# Patient Record
Sex: Female | Born: 1955 | Race: White | Hispanic: No | Marital: Married | Smoking: Former smoker
Health system: Southern US, Community
[De-identification: ages and names within clinical notes are randomized; demographics above are authoritative.]

## PROBLEM LIST (undated history)

## (undated) DIAGNOSIS — Z87442 Personal history of urinary calculi: Secondary | ICD-10-CM

## (undated) DIAGNOSIS — Z87440 Personal history of urinary (tract) infections: Secondary | ICD-10-CM

## (undated) HISTORY — DX: Personal history of urinary calculi: Z87.442

## (undated) HISTORY — DX: Personal history of urinary (tract) infections: Z87.440

---

## 2012-06-14 HISTORY — PX: FOOT SURGERY: SHX648

## 2013-09-30 ENCOUNTER — Emergency Department: Payer: Self-pay | Admitting: Emergency Medicine

## 2014-06-14 HISTORY — PX: BREAST BIOPSY: SHX20

## 2014-11-17 DIAGNOSIS — R928 Other abnormal and inconclusive findings on diagnostic imaging of breast: Secondary | ICD-10-CM | POA: Insufficient documentation
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
## 2016-07-15 DIAGNOSIS — Z23 Encounter for immunization: Secondary | ICD-10-CM | POA: Diagnosis not present

## 2016-12-20 ENCOUNTER — Other Ambulatory Visit (HOSPITAL_COMMUNITY)
Admission: RE | Admit: 2016-12-20 | Discharge: 2016-12-20 | Disposition: A | Discharge status: Home / Self Care | Payer: BLUE CROSS/BLUE SHIELD | Source: Ambulatory Visit | Attending: Primary Care | Admitting: Primary Care

## 2016-12-20 ENCOUNTER — Ambulatory Visit (INDEPENDENT_AMBULATORY_CARE_PROVIDER_SITE_OTHER): Payer: BLUE CROSS/BLUE SHIELD | Admitting: Primary Care

## 2016-12-20 ENCOUNTER — Encounter: Payer: Self-pay | Admitting: Primary Care

## 2016-12-20 VITALS — BP 140/80 | HR 87 | Temp 98.7°F | Ht 65.75 in | Wt 151.1 lb

## 2016-12-20 DIAGNOSIS — Z1231 Encounter for screening mammogram for malignant neoplasm of breast: Secondary | ICD-10-CM

## 2016-12-20 DIAGNOSIS — Z0000 Encounter for general adult medical examination without abnormal findings: Secondary | ICD-10-CM

## 2016-12-20 DIAGNOSIS — Z23 Encounter for immunization: Secondary | ICD-10-CM | POA: Diagnosis not present

## 2016-12-20 DIAGNOSIS — Z1239 Encounter for other screening for malignant neoplasm of breast: Secondary | ICD-10-CM

## 2016-12-20 MED ORDER — ZOSTER VAC RECOMB ADJUVANTED 50 MCG/0.5ML IM SUSR: 0.5000 mL | Freq: Once | INTRAMUSCULAR | 1 refills | Status: AC

## 2016-12-20 NOTE — Assessment & Plan Note (Signed)
Shingles vaccination due, Rx printed today. All other immunizations UTD. Mammogram due, ordered and pending. Colonoscopy UTD. Pap due, completed and pending. Exam unremarkable. Labs pending. Follow up in 1 year for annual exam.

## 2016-12-20 NOTE — Patient Instructions (Addendum)
You will be contacted regarding your mammogram.  Please let us know if you have not heard back within one week.   Take the shingles vaccination to your pharmacy for administration. This is a two part vaccination series.  Continue exercising. You should be getting 150 minutes of moderate intensity exercise weekly.  Ensure you are consuming 64 ounces of water daily.  We will notify you of your pap results once received.   Schedule a lab only appointment up front to return fasting for your labs.  Follow up in 1 year for annual exam or sooner if needed.  It was a pleasure to meet you today! Please don't hesitate to call me with any questions. Welcome to [facility]!

## 2016-12-20 NOTE — Progress Notes (Signed)
Subjective:    Patient ID: Kayla Wood, female    DOB: [birth year], 61 y.o.   MRN: [id number]  HPI  Ms. Kayla Wood is a 61 year old female who presents today to establish care and for complete physical. Will obtain old records.   Immunizations: -Tetanus: Completed in 2016 -Influenza: Did complete last season -Shingles: Never completed  Diet: Endorses a healthy diet Breakfast: Yogurt, granola, fruit Lunch: Salad with protein, [place] sandwich, beans Dinner: Meat, vegetable, salad Snacks: Popcorn, pretzels Desserts: Chocolate occasionally  Beverages: Water, coffee in the morning, diet soda, red wine  Exercise: She walks 4-5 days weekly, runs 2 days weekly. Works out at [employer] 2 days weekly. Eye exam: Completed in January 2018 Dental exam: Completes semi-annually Colonoscopy: Completed in 2012, thinks she's due now.  Pap Smear: Completed in 2013, due.  Mammogram: Completed in 2016, due.    Review of Systems  Constitutional: Negative for unexpected weight change.  HENT: Negative for rhinorrhea.   Respiratory: Negative for cough and shortness of breath.   Cardiovascular: Negative for chest pain.  Gastrointestinal: Negative for constipation and diarrhea.  Genitourinary: Negative for difficulty urinating and menstrual problem.  Musculoskeletal: Negative for arthralgias and myalgias.  Skin: Negative for rash.  Allergic/Immunologic: Negative for environmental allergies.  Neurological: Negative for dizziness, numbness and headaches.  Psychiatric/Behavioral:       No concerns for anxiety or depression       Past Medical History:  Diagnosis Date  . History of kidney stones   . History of UTI      Social History   Social History  . Marital status: Married    Spouse name: N/A  . Number of children: N/A  . Years of education: N/A   Occupational History  . Not on file.   Social History Main Topics  . Smoking status: Former Games developer  . Smokeless tobacco: Never Used  . Alcohol  use Yes  . Drug use: Unknown  . Sexual activity: Not on file   Other Topics Concern  . Not on file   Social History Narrative   Married.   3 children.   Works for [employer].   Enjoys music, exercising, reading.     Past Surgical History:  Procedure Laterality Date  . BREAST BIOPSY Left 2016  . CESAREAN SECTION  1988  . FOOT SURGERY Bilateral 2014    Family History  Problem Relation Age of Onset  . Heart disease Father   . Breast cancer Sister   . Alcohol abuse Maternal Uncle   . Alcohol abuse Maternal Uncle     No Known Allergies  No current outpatient prescriptions on file prior to visit.   No current facility-administered medications on file prior to visit.     BP 140/80   Pulse 87   Temp 98.7 F (37.1 C) (Oral)   Ht 5' 5.75" (1.67 m)   Wt 151 lb 1.9 oz (68.5 kg)   SpO2 98%   BMI 24.58 kg/m    Objective:   Physical Exam  Constitutional: She is oriented to person, place, and time. She appears well-nourished.  HENT:  Right Ear: Tympanic membrane and ear canal normal.  Left Ear: Tympanic membrane and ear canal normal.  Nose: Nose normal.  Mouth/Throat: Oropharynx is clear and moist.  Eyes: Conjunctivae and EOM are normal. Pupils are equal, round, and reactive to light.  Neck: Neck supple. No thyromegaly present.  Cardiovascular: Normal rate and regular rhythm.   No murmur heard. Pulmonary/Chest: Effort  normal and breath sounds normal. She has no rales.  Abdominal: Soft. Bowel sounds are normal. There is no tenderness.  Genitourinary: There is no tenderness or lesion on the right labia. There is no tenderness or lesion on the left labia. Cervix exhibits no motion tenderness and no discharge. Right adnexum displays no tenderness. Left adnexum displays no tenderness. No erythema in the vagina. No vaginal discharge found.  Musculoskeletal: Normal range of motion.  Lymphadenopathy:    She has no cervical adenopathy.  Neurological: She is alert and oriented to  person, place, and time. She has normal reflexes. No cranial nerve deficit.  Skin: Skin is warm and dry. No rash noted.  Psychiatric: She has a normal mood and affect.          Assessment & Plan:

## 2016-12-21 ENCOUNTER — Telehealth: Payer: Self-pay | Admitting: Gastroenterology

## 2016-12-21 ENCOUNTER — Encounter: Payer: Self-pay | Admitting: Primary Care

## 2016-12-21 DIAGNOSIS — Z1211 Encounter for screening for malignant neoplasm of colon: Secondary | ICD-10-CM

## 2016-12-22 LAB — CYTOLOGY - PAP
Diagnosis: NEGATIVE
HPV: NOT DETECTED

## 2016-12-24 ENCOUNTER — Encounter: Payer: Self-pay | Admitting: Gastroenterology

## 2016-12-24 ENCOUNTER — Other Ambulatory Visit (INDEPENDENT_AMBULATORY_CARE_PROVIDER_SITE_OTHER): Payer: BLUE CROSS/BLUE SHIELD

## 2016-12-24 DIAGNOSIS — Z0000 Encounter for general adult medical examination without abnormal findings: Secondary | ICD-10-CM | POA: Diagnosis not present

## 2016-12-24 LAB — LIPID PANEL
Cholesterol: 194 mg/dL (ref 0–200)
HDL: 62.3 mg/dL (ref >39.00)
LDL CALC: 121 mg/dL — AB (ref 0–99)
NONHDL: 131.83
Total CHOL/HDL Ratio: 3
Triglycerides: 52 mg/dL (ref 0.0–149.0)
VLDL: 10.4 mg/dL (ref 0.0–40.0)

## 2016-12-24 LAB — COMPREHENSIVE METABOLIC PANEL
ALK PHOS: 69 U/L (ref 39–117)
ALT: 15 U/L (ref 0–35)
AST: 21 U/L (ref 0–37)
Albumin: 4.6 g/dL (ref 3.5–5.2)
BUN: 21 mg/dL (ref 6–23)
CHLORIDE: 103 meq/L (ref 96–112)
CO2: 28 mEq/L (ref 19–32)
Calcium: 9.9 mg/dL (ref 8.4–10.5)
Creatinine, Ser: 0.74 mg/dL (ref 0.40–1.20)
GFR: 84.74 mL/min (ref >60.00)
GLUCOSE: 102 mg/dL — AB (ref 70–99)
Potassium: 4.8 mEq/L (ref 3.5–5.1)
SODIUM: 139 meq/L (ref 135–145)
Total Bilirubin: 1 mg/dL (ref 0.2–1.2)
Total Protein: 7.4 g/dL (ref 6.0–8.3)

## 2016-12-24 NOTE — Telephone Encounter (Signed)
Dr.Armbruster reviewed records and accepted to see patient for an office visit to discuss direct colonoscopy. Notified patient of this and have scheduled for next available office visit.

## 2016-12-28 ENCOUNTER — Encounter: Payer: Self-pay | Admitting: Primary Care

## 2016-12-31 DIAGNOSIS — Z1231 Encounter for screening mammogram for malignant neoplasm of breast: Secondary | ICD-10-CM | POA: Diagnosis not present

## 2017-01-03 ENCOUNTER — Encounter: Payer: Self-pay | Admitting: Primary Care

## 2017-02-15 ENCOUNTER — Encounter: Payer: Self-pay | Admitting: Gastroenterology

## 2017-02-15 ENCOUNTER — Ambulatory Visit (INDEPENDENT_AMBULATORY_CARE_PROVIDER_SITE_OTHER): Payer: BLUE CROSS/BLUE SHIELD | Admitting: Gastroenterology

## 2017-02-15 VITALS — BP 130/80 | HR 80 | Ht 65.75 in | Wt 154.8 lb

## 2017-02-15 DIAGNOSIS — Z1211 Encounter for screening for malignant neoplasm of colon: Secondary | ICD-10-CM | POA: Diagnosis not present

## 2017-02-15 NOTE — Patient Instructions (Signed)
Follow up as needed.  If you are age 61 or older, your body mass index should be between 23-30. Your Body mass index is 25.18 kg/m. If this is out of the aforementioned range listed, please consider follow up with your Primary Care Provider.  If you are age 11064 or younger, your body mass index should be between 19-25. Your Body mass index is 25.18 kg/m. If this is out of the aformentioned range listed, please consider follow up with your Primary Care Provider.

## 2017-02-15 NOTE — Progress Notes (Signed)
   HPI :  61 y/o female with a history of renal stones, otherwise healthy referred here by Vernona RiegerKatherine Clark NP to discuss timing of next screening colonoscopy.   No blood in the stools. NO FH of colon cancer or advanced colon polyps / adenomas. No constipation or diarrhea. No abdominal pains. No weight loss. She feels well in general without complaints, no significant comorbidities  Colonoscopy 02/17/2011 - done by Dr. Jovita GammaMasud Hashmi at [facility] in [place]. Good prep. Normal colon without polyps, but recommendation in the report to repeat the colonoscopy in 5 years. The patient is confused by this recommendation.  Past Medical History:  Diagnosis Date  . History of kidney stones   . History of UTI      Past Surgical History:  Procedure Laterality Date  . BREAST BIOPSY Left 2016  . CESAREAN SECTION  1988  . FOOT SURGERY Bilateral 2014   Family History  Problem Relation Age of Onset  . Heart disease Father   . Breast cancer Sister   . Irritable bowel syndrome Sister   . Alcohol abuse Maternal Uncle   . Alcohol abuse Maternal Uncle    Social History  Substance Use Topics  . Smoking status: Former Games developermoker  . Smokeless tobacco: Never Used  . Alcohol use Yes   Current Outpatient Prescriptions  Medication Sig Dispense Refill  . ibuprofen (ADVIL,MOTRIN) 200 MG tablet Take 200 mg by mouth every 8 (eight) hours as needed.     No current facility-administered medications for this visit.    No Known Allergies   Review of Systems: All systems reviewed and negative except where noted in HPI.   No results found for: WBC, HGB, HCT, MCV, PLT  .  Physical Exam: BP 130/80   Pulse 80   Ht 5' 5.75" (1.67 m)   Wt 154 lb 12.8 oz (70.2 kg)   BMI 25.18 kg/m  Constitutional: Pleasant,well-developed, female in no acute distress. HEENT: Normocephalic and atraumatic. Conjunctivae are normal. No scleral icterus. Neck supple.  Cardiovascular: Normal rate, regular rhythm.    Pulmonary/chest: Effort normal and breath sounds normal. No wheezing, rales or rhonchi. Abdominal: Soft, nondistended, nontender. . There are no masses palpable. No hepatomegaly. Extremities: no edema Lymphadenopathy: No cervical adenopathy noted. Neurological: Alert and oriented to person place and time. Skin: Skin is warm and dry. No rashes noted. Psychiatric: Normal mood and affect. Behavior is normal.   ASSESSMENT AND PLAN: 61 year old healthy female here to discuss colon cancer screening in the timing of her next colonoscopy. I reviewed her last colonoscopy - the preparation was reported as good, she had no polyps, visualization was good. She has no family history of colon cancer or advanced polyps. She has no anemia. I don't see any reason why she was told she another colonoscopy in 5 years from her last exam. Based on the results of her last exam she should not be due for screening until September 2022. We discussed this and she agreed, she did not want a colonoscopy unless she had a strong indication for it. She has symptoms in the interim she can contact me for reassessment.  Ileene PatrickSteven Najai Waszak, MD [facility] Pager [phone]  CC: Doreene Nestlark, Katherine K, NP

## 2017-05-10 DIAGNOSIS — Z23 Encounter for immunization: Secondary | ICD-10-CM | POA: Diagnosis not present

## 2017-08-10 DIAGNOSIS — L814 Other melanin hyperpigmentation: Secondary | ICD-10-CM | POA: Diagnosis not present

## 2017-08-10 DIAGNOSIS — L718 Other rosacea: Secondary | ICD-10-CM | POA: Diagnosis not present

## 2017-09-02 DIAGNOSIS — Z23 Encounter for immunization: Secondary | ICD-10-CM | POA: Diagnosis not present

## 2017-11-21 DIAGNOSIS — L718 Other rosacea: Secondary | ICD-10-CM | POA: Diagnosis not present

## 2017-11-21 DIAGNOSIS — L821 Other seborrheic keratosis: Secondary | ICD-10-CM | POA: Diagnosis not present

## 2017-11-21 DIAGNOSIS — L814 Other melanin hyperpigmentation: Secondary | ICD-10-CM | POA: Diagnosis not present

## 2018-03-14 DIAGNOSIS — Z1231 Encounter for screening mammogram for malignant neoplasm of breast: Secondary | ICD-10-CM | POA: Diagnosis not present

## 2018-04-21 DIAGNOSIS — Z23 Encounter for immunization: Secondary | ICD-10-CM | POA: Diagnosis not present

## 2018-06-19 DIAGNOSIS — L718 Other rosacea: Secondary | ICD-10-CM | POA: Diagnosis not present

## 2018-12-18 DIAGNOSIS — L718 Other rosacea: Secondary | ICD-10-CM | POA: Diagnosis not present

## 2019-04-03 DIAGNOSIS — Z1231 Encounter for screening mammogram for malignant neoplasm of breast: Secondary | ICD-10-CM | POA: Diagnosis not present

## 2019-04-05 ENCOUNTER — Encounter: Payer: Self-pay | Admitting: Primary Care

## 2019-05-03 DIAGNOSIS — L718 Other rosacea: Secondary | ICD-10-CM | POA: Diagnosis not present

## 2019-05-16 DIAGNOSIS — R928 Other abnormal and inconclusive findings on diagnostic imaging of breast: Secondary | ICD-10-CM | POA: Diagnosis not present

## 2019-05-16 LAB — HM MAMMOGRAPHY

## 2019-05-17 ENCOUNTER — Encounter: Payer: Self-pay | Admitting: Primary Care

## 2019-08-20 ENCOUNTER — Ambulatory Visit (INDEPENDENT_AMBULATORY_CARE_PROVIDER_SITE_OTHER)
Admission: RE | Admit: 2019-08-20 | Discharge: 2019-08-20 | Disposition: A | Discharge status: Home / Self Care | Payer: BC Managed Care – PPO | Source: Ambulatory Visit

## 2019-08-20 DIAGNOSIS — N23 Unspecified renal colic: Secondary | ICD-10-CM | POA: Diagnosis not present

## 2019-08-20 MED ORDER — KETOROLAC TROMETHAMINE 10 MG PO TABS: 10.0000 mg | ORAL_TABLET | Freq: Three times a day (TID) | ORAL | 0 refills | Status: DC | PRN

## 2019-08-20 NOTE — ED Provider Notes (Signed)
Virtual Visit via Video Note:  Kayla Wood  initiated request for Telemedicine visit with [facility] team. I connected with Kayla Wood  on 08/20/2019 at 4:25 PM  for a synchronized telemedicine visit using a video enabled HIPPA compliant telemedicine application. I verified that I am speaking with Kayla Wood  using two identifiers. Kayla Hall-Potvin, PA-C  was physically located in a [facility] site and Kayla Wood was located at a different location.   The limitations of evaluation and management by telemedicine as well as the availability of in-person appointments were discussed. Patient was informed that she  may incur a bill ( including co-pay) for this virtual visit encounter. Kayla Wood  expressed understanding and gave verbal consent to proceed with virtual visit.     History of Present Illness:Kayla Wood  is a 64 y.o. female presents with left flank pain since Friday.  States pain is worse at night when laying down, though tolerable during the day.  States is a dull, aching, constant during the day, sharp at night.  Patient does endorse history of kidney stone: 1 episode of nonobstructing stone 8 to 10 years ago.  States this feels similar.  Patient denies overlying rash.  Did have single episode of emesis on Saturday without blood or bile.  No nausea, vomiting, abdominal pain since.  Patient denies change in bowel movement or urinary habits: No blood, melena in bowel, blood in urine.  States she does have a history of chickenpox: Had Shingrix vaccine 2 years ago.  Denies recent change in medications or activity level.  No trauma/injury to the area.  Patient denies fever, chills, arthralgias or myalgias, chest pain, difficulty breathing, cough.  Has taken Tylenol, ibuprofen without significant relief in pain.     ROI as per HPI  Past Medical History:  Diagnosis Date  . History of kidney stones   . History of UTI     No Known Allergies       Observations/Objective: 64 year old female sitting in no acute distress, appears to be nontoxic.  Patient is able to speak in full sentences without coughing, sneezing, wheezing.  No rash visualized over left flank.  Assessment and Plan: We will treat for possible renal lithiasis given renal colic and patient's history.  Provided anticipatory guidance regarding shingles.  Patient follow-up with PCP for repeat evaluation if needed.  Discussed utility of urine dipstick should there be no improvement with Toradol.  Return precautions discussed, patient verbalized understanding and is agreeable to plan.  Follow Up Instructions: Patient to seek in-person evaluation for persistent or worsening symptoms as outlined in patient instructions.   I discussed the assessment and treatment plan with the patient. The patient was provided an opportunity to ask questions and all were answered. The patient agreed with the plan and demonstrated an understanding of the instructions.   The patient was advised to call back or seek an in-person evaluation if the symptoms worsen or if the condition fails to improve as anticipated.  I provided 20 minutes of non-face-to-face time during this encounter.    Kayla Hall-Potvin, PA-C  08/20/2019 4:25 PM        Wood, Kayla, [place] 08/20/19 1648

## 2019-08-20 NOTE — Discharge Instructions (Signed)
Take Toradol as directed with food. Use hot compresses, Tylenol for additional pain relief. Important follow-up with primary care to inform them of today's visit and treatment. We will need to be seen in person for persistent or worsening symptoms such as fever, nausea/vomiting, abdominal pain, decreased urine output, cola colored urine, blood in urine, inability to urinate, black or blood in stools.

## 2019-08-22 ENCOUNTER — Other Ambulatory Visit: Payer: Self-pay | Admitting: Primary Care

## 2019-08-22 ENCOUNTER — Ambulatory Visit: Payer: BC Managed Care – PPO | Admitting: Primary Care

## 2019-08-22 ENCOUNTER — Ambulatory Visit (INDEPENDENT_AMBULATORY_CARE_PROVIDER_SITE_OTHER)
Admission: RE | Admit: 2019-08-22 | Discharge: 2019-08-22 | Disposition: A | Discharge status: Home / Self Care | Payer: BC Managed Care – PPO | Source: Ambulatory Visit | Attending: Primary Care | Admitting: Primary Care

## 2019-08-22 ENCOUNTER — Other Ambulatory Visit: Payer: Self-pay

## 2019-08-22 VITALS — BP 136/86 | HR 84 | Temp 96.7°F | Ht 65.75 in | Wt 178.8 lb

## 2019-08-22 DIAGNOSIS — R109 Unspecified abdominal pain: Secondary | ICD-10-CM | POA: Diagnosis not present

## 2019-08-22 LAB — POC URINALSYSI DIPSTICK (AUTOMATED)
Bilirubin, UA: NEGATIVE
Blood, UA: NEGATIVE
Glucose, UA: NEGATIVE
Ketones, UA: NEGATIVE
Leukocytes, UA: NEGATIVE
Nitrite, UA: NEGATIVE
Protein, UA: NEGATIVE
Spec Grav, UA: <=1.005 — AB (ref 1.010–1.025)
Urobilinogen, UA: 0.2 E.U./dL
pH, UA: 6 (ref 5.0–8.0)

## 2019-08-22 MED ORDER — CYCLOBENZAPRINE HCL 5 MG PO TABS: 5.0000 mg | ORAL_TABLET | Freq: Every evening | ORAL | 0 refills | Status: DC | PRN

## 2019-08-22 NOTE — Assessment & Plan Note (Addendum)
Acute for the last five days, overall about the same. History of renal stones 8 years ago, this feels somewhat similar but more mild.  Exam today stable, she is in no distress, no CVA tenderness. She has normal ROM, doesn't seem to be MSK related especially since she can walk 3 miles without pain. No evidence for shingles.   UA today negative.  Check plain films in the office today, consider CT renal stone study if needed. Continue Ibuprofen PRN.

## 2019-08-22 NOTE — Progress Notes (Signed)
Subjective:    Patient ID: Kayla Wood, female    DOB: [birth year], 64 y.o.   MRN: [id number]  HPI  This visit occurred during the SARS-CoV-2 public health emergency.  Safety protocols were in place, including screening questions prior to the visit, additional usage of staff PPE, and extensive cleaning of exam room while observing appropriate contact time as indicated for disinfecting solutions.   Kayla Wood is a 64 year old female with a history of renal stones, UTI who presents today with a chief complaint of flank pain.   Her pain is located to the left lower flank for which she noticed about five days ago with a "twinge" sensation. Her pain is noticeable during the day, but not bothersome. Only bothersome when laying down at night. She describes her pain as a "stabbing" sensation when it does bother her. She has noticed some nausea, vomited five days ago. This morning she walked 3 miles and had no problem.   She denies hematuria, urinary frequency, dysuria, radiation of pain, fevers, abdominal pain, rash, recent strenuous activity, heavy lifting. She endorses drinking a lot of water.   She completed a virtual visit on 08/20/19, treated for "possible renal lithiasis" with Toradol. She's not noticed improvement.   BP Readings from Last 3 Encounters:  08/22/19 136/86  02/15/17 130/80  12/20/16 140/80     Review of Systems  Constitutional: Negative for fever.  Gastrointestinal: Positive for nausea and vomiting. Negative for abdominal pain.  Genitourinary: Positive for flank pain. Negative for dysuria, frequency and hematuria.       Past Medical History:  Diagnosis Date  . History of kidney stones   . History of UTI      Social History   Socioeconomic History  . Marital status: Married    Spouse name: Not on file  . Number of children: 3  . Years of education: Not on file  . Highest education level: Not on file  Occupational History  . Not on file  Tobacco Use  . Smoking  status: Former Games developer  . Smokeless tobacco: Never Used  Substance and Sexual Activity  . Alcohol use: Yes  . Drug use: No  . Sexual activity: Not on file  Other Topics Concern  . Not on file  Social History Narrative   Married.   3 children.   Works for [employer].   Enjoys music, exercising, reading.    Social Determinants of Health   Financial Resource Strain:   . Difficulty of Paying Living Expenses: Not on file  Food Insecurity:   . Worried About Programme researcher, broadcasting/film/video in the Last Year: Not on file  . Ran Out of Food in the Last Year: Not on file  Transportation Needs:   . Lack of Transportation (Medical): Not on file  . Lack of Transportation (Non-Medical): Not on file  Physical Activity:   . Days of Exercise per Week: Not on file  . Minutes of Exercise per Session: Not on file  Stress:   . Feeling of Stress : Not on file  Social Connections:   . Frequency of Communication with Friends and Family: Not on file  . Frequency of Social Gatherings with Friends and Family: Not on file  . Attends Religious Services: Not on file  . Active Member of Clubs or Organizations: Not on file  . Attends Banker Meetings: Not on file  . Marital Status: Not on file  Intimate Partner Violence:   . Fear of Current  or Ex-Partner: Not on file  . Emotionally Abused: Not on file  . Physically Abused: Not on file  . Sexually Abused: Not on file    Past Surgical History:  Procedure Laterality Date  . BREAST BIOPSY Left 2016  . CESAREAN SECTION  1988  . FOOT SURGERY Bilateral 2014    Family History  Problem Relation Age of Onset  . Heart disease Father   . Breast cancer Sister   . Irritable bowel syndrome Sister   . Alcohol abuse Maternal Uncle   . Alcohol abuse Maternal Uncle     No Known Allergies  Current Outpatient Medications on File Prior to Visit  Medication Sig Dispense Refill  . ibuprofen (ADVIL,MOTRIN) 200 MG tablet Take 200 mg by mouth every 8 (eight) hours as  needed.     No current facility-administered medications on file prior to visit.    BP 136/86   Pulse 84   Temp (!) 96.7 F (35.9 C) (Temporal)   Ht 5' 5.75" (1.67 m)   Wt 178 lb 12 oz (81.1 kg)   SpO2 97%   BMI 29.07 kg/m    Objective:   Physical Exam  Constitutional: She appears well-nourished.  Cardiovascular: Normal rate and regular rhythm.  GI: There is no CVA tenderness.  Musculoskeletal:        General: Normal range of motion.     Cervical back: Neck supple.  Skin: Skin is warm and dry.           Assessment & Plan:

## 2019-08-22 NOTE — Patient Instructions (Signed)
Complete xray(s) prior to leaving today. I will notify you of your results once received.  It was a pleasure to see you today!  

## 2019-08-24 DIAGNOSIS — R109 Unspecified abdominal pain: Secondary | ICD-10-CM

## 2019-08-31 ENCOUNTER — Ambulatory Visit: Payer: BC Managed Care – PPO | Attending: Internal Medicine

## 2019-08-31 DIAGNOSIS — Z23 Encounter for immunization: Secondary | ICD-10-CM

## 2019-08-31 NOTE — Progress Notes (Signed)
   Covid-19 Vaccination Clinic  Name:  Kayla Wood    MRN: [id number] DOB: [birth year]  08/31/2019  [name] was observed post Covid-19 immunization for 15 minutes without incident. She was provided with Vaccine Information Sheet and instruction to access the V-Safe system.   [name] was instructed to call 911 with any severe reactions post vaccine: Marland Kitchen Difficulty breathing  . Swelling of face and throat  . A fast heartbeat  . A bad rash all over body  . Dizziness and weakness   Immunizations Administered    Name Date Dose VIS Date Route   Pfizer COVID-19 Vaccine 08/31/2019  1:30 PM 0.3 mL 05/25/2019 Intramuscular   Manufacturer: [employer], [employer]   Lot: [id number]   NDC: [address]-0

## 2019-09-03 ENCOUNTER — Ambulatory Visit
Admission: RE | Admit: 2019-09-03 | Discharge: 2019-09-03 | Disposition: A | Discharge status: Home / Self Care | Payer: BC Managed Care – PPO | Source: Ambulatory Visit | Attending: Primary Care | Admitting: Primary Care

## 2019-09-03 ENCOUNTER — Other Ambulatory Visit: Payer: Self-pay

## 2019-09-03 DIAGNOSIS — R109 Unspecified abdominal pain: Secondary | ICD-10-CM | POA: Insufficient documentation

## 2019-09-03 DIAGNOSIS — N200 Calculus of kidney: Secondary | ICD-10-CM | POA: Diagnosis not present

## 2019-09-03 DIAGNOSIS — K449 Diaphragmatic hernia without obstruction or gangrene: Secondary | ICD-10-CM | POA: Diagnosis not present

## 2019-09-03 DIAGNOSIS — R161 Splenomegaly, not elsewhere classified: Secondary | ICD-10-CM | POA: Diagnosis not present

## 2019-09-03 DIAGNOSIS — K5730 Diverticulosis of large intestine without perforation or abscess without bleeding: Secondary | ICD-10-CM | POA: Diagnosis not present

## 2019-09-25 ENCOUNTER — Ambulatory Visit: Payer: BC Managed Care – PPO | Attending: Internal Medicine

## 2019-09-25 DIAGNOSIS — Z23 Encounter for immunization: Secondary | ICD-10-CM

## 2019-09-25 NOTE — Progress Notes (Signed)
   Covid-19 Vaccination Clinic  Name:  Kayla Wood    MRN: [id number] DOB: 1956/01/08  09/25/2019  [name] was observed post Covid-19 immunization for 15 minutes without incident. She was provided with Vaccine Information Sheet and instruction to access the V-Safe system.   [name] was instructed to call 911 with any severe reactions post vaccine: Marland Kitchen Difficulty breathing  . Swelling of face and throat  . A fast heartbeat  . A bad rash all over body  . Dizziness and weakness   Immunizations Administered    Name Date Dose VIS Date Route   Pfizer COVID-19 Vaccine 09/25/2019  2:26 PM 0.3 mL 05/25/2019 Intramuscular   Manufacturer: [employer], [employer]   Lot: [id number]   NDC: [address]-4

## 2019-12-03 DIAGNOSIS — S63659D Sprain of metacarpophalangeal joint of unspecified finger, subsequent encounter: Secondary | ICD-10-CM | POA: Diagnosis not present

## 2019-12-03 DIAGNOSIS — M25641 Stiffness of right hand, not elsewhere classified: Secondary | ICD-10-CM | POA: Diagnosis not present

## 2019-12-03 DIAGNOSIS — S63659A Sprain of metacarpophalangeal joint of unspecified finger, initial encounter: Secondary | ICD-10-CM | POA: Diagnosis not present

## 2019-12-03 DIAGNOSIS — M65341 Trigger finger, right ring finger: Secondary | ICD-10-CM | POA: Diagnosis not present

## 2019-12-27 DIAGNOSIS — M20091 Other deformity of right finger(s): Secondary | ICD-10-CM | POA: Diagnosis not present

## 2019-12-27 DIAGNOSIS — M79672 Pain in left foot: Secondary | ICD-10-CM | POA: Diagnosis not present

## 2019-12-27 DIAGNOSIS — M79671 Pain in right foot: Secondary | ICD-10-CM | POA: Diagnosis not present

## 2019-12-27 DIAGNOSIS — M2041 Other hammer toe(s) (acquired), right foot: Secondary | ICD-10-CM | POA: Diagnosis not present

## 2020-01-01 DIAGNOSIS — M65341 Trigger finger, right ring finger: Secondary | ICD-10-CM | POA: Diagnosis not present

## 2020-01-01 DIAGNOSIS — S63659A Sprain of metacarpophalangeal joint of unspecified finger, initial encounter: Secondary | ICD-10-CM | POA: Diagnosis not present

## 2020-01-14 DIAGNOSIS — M2041 Other hammer toe(s) (acquired), right foot: Secondary | ICD-10-CM | POA: Diagnosis not present

## 2020-01-14 DIAGNOSIS — M20091 Other deformity of right finger(s): Secondary | ICD-10-CM | POA: Diagnosis not present

## 2020-01-14 DIAGNOSIS — M79671 Pain in right foot: Secondary | ICD-10-CM | POA: Diagnosis not present

## 2020-01-14 DIAGNOSIS — M79672 Pain in left foot: Secondary | ICD-10-CM | POA: Diagnosis not present

## 2020-03-07 DIAGNOSIS — S66316A Strain of extensor muscle, fascia and tendon of right little finger at wrist and hand level, initial encounter: Secondary | ICD-10-CM | POA: Diagnosis not present

## 2020-03-07 DIAGNOSIS — G8918 Other acute postprocedural pain: Secondary | ICD-10-CM | POA: Diagnosis not present

## 2020-03-07 DIAGNOSIS — S66312A Strain of extensor muscle, fascia and tendon of right middle finger at wrist and hand level, initial encounter: Secondary | ICD-10-CM | POA: Diagnosis not present

## 2020-03-07 DIAGNOSIS — M66231 Spontaneous rupture of extensor tendons, right forearm: Secondary | ICD-10-CM | POA: Diagnosis not present

## 2020-03-07 DIAGNOSIS — S66314A Strain of extensor muscle, fascia and tendon of right ring finger at wrist and hand level, initial encounter: Secondary | ICD-10-CM | POA: Diagnosis not present

## 2020-03-07 HISTORY — PX: HAND SURGERY: SHX662

## 2020-03-11 DIAGNOSIS — R600 Localized edema: Secondary | ICD-10-CM | POA: Diagnosis not present

## 2020-03-11 DIAGNOSIS — M25641 Stiffness of right hand, not elsewhere classified: Secondary | ICD-10-CM | POA: Diagnosis not present

## 2020-03-11 DIAGNOSIS — S63659A Sprain of metacarpophalangeal joint of unspecified finger, initial encounter: Secondary | ICD-10-CM | POA: Diagnosis not present

## 2020-03-11 DIAGNOSIS — M79644 Pain in right finger(s): Secondary | ICD-10-CM | POA: Diagnosis not present

## 2020-05-20 ENCOUNTER — Encounter: Payer: Self-pay | Admitting: Primary Care

## 2020-05-20 ENCOUNTER — Other Ambulatory Visit (HOSPITAL_COMMUNITY)
Admission: RE | Admit: 2020-05-20 | Discharge: 2020-05-20 | Disposition: A | Discharge status: Home / Self Care | Payer: BC Managed Care – PPO | Source: Ambulatory Visit | Attending: Primary Care | Admitting: Primary Care

## 2020-05-20 ENCOUNTER — Ambulatory Visit (INDEPENDENT_AMBULATORY_CARE_PROVIDER_SITE_OTHER): Payer: BC Managed Care – PPO | Admitting: Primary Care

## 2020-05-20 ENCOUNTER — Other Ambulatory Visit: Payer: Self-pay

## 2020-05-20 VITALS — BP 126/82 | HR 68 | Temp 97.2°F | Ht 67.5 in | Wt 169.0 lb

## 2020-05-20 DIAGNOSIS — Z0000 Encounter for general adult medical examination without abnormal findings: Secondary | ICD-10-CM | POA: Diagnosis not present

## 2020-05-20 DIAGNOSIS — Z1211 Encounter for screening for malignant neoplasm of colon: Secondary | ICD-10-CM

## 2020-05-20 DIAGNOSIS — Z124 Encounter for screening for malignant neoplasm of cervix: Secondary | ICD-10-CM | POA: Diagnosis not present

## 2020-05-20 NOTE — Patient Instructions (Signed)
Please scan your labs onto My Chart once complete.  You will be contacted regarding your referral to GI for the colonoscopy.  Please let us know if you have not been contacted within two weeks.   Continue exercising. You should be getting 150 minutes of moderate intensity exercise weekly.  Continue to work on a healthy diet. Ensure you are consuming 64 ounces of water daily.  Complete your mammogram as scheduled.  Make sure to take at least 1200 mg of calcium and 800 IU of vitamin D daily for bone health.  It was a pleasure to see you today!   Preventive Care 69-64 Years Old, Female Preventive care refers to visits with your health care provider and lifestyle choices that can promote health and wellness. This includes:  A yearly physical exam. This may also be called an annual well check.  Regular dental visits and eye exams.  Immunizations.  Screening for certain conditions.  Healthy lifestyle choices, such as eating a healthy diet, getting regular exercise, not using drugs or products that contain nicotine and tobacco, and limiting alcohol use. What can I expect for my preventive care visit? Physical exam Your health care provider will check your:  Height and weight. This may be used to calculate body mass index (BMI), which tells if you are at a healthy weight.  Heart rate and blood pressure.  Skin for abnormal spots. Counseling Your health care provider may ask you questions about your:  Alcohol, tobacco, and drug use.  Emotional well-being.  Home and relationship well-being.  Sexual activity.  Eating habits.  Work and work Statistician.  Method of birth control.  Menstrual cycle.  Pregnancy history. What immunizations do I need?  Influenza (flu) vaccine  This is recommended every year. Tetanus, diphtheria, and pertussis (Tdap) vaccine  You may need a Td booster every 10 years. Varicella (chickenpox) vaccine  You may need this if you have not been  vaccinated. Zoster (shingles) vaccine  You may need this after age 2. Measles, mumps, and rubella (MMR) vaccine  You may need at least one dose of MMR if you were born in 1957 or later. You may also need a second dose. Pneumococcal conjugate (PCV13) vaccine  You may need this if you have certain conditions and were not previously vaccinated. Pneumococcal polysaccharide (PPSV23) vaccine  You may need one or two doses if you smoke cigarettes or if you have certain conditions. Meningococcal conjugate (MenACWY) vaccine  You may need this if you have certain conditions. Hepatitis A vaccine  You may need this if you have certain conditions or if you travel or work in places where you may be exposed to hepatitis A. Hepatitis B vaccine  You may need this if you have certain conditions or if you travel or work in places where you may be exposed to hepatitis B. Haemophilus influenzae type b (Hib) vaccine  You may need this if you have certain conditions. Human papillomavirus (HPV) vaccine  If recommended by your health care provider, you may need three doses over 6 months. You may receive vaccines as individual doses or as more than one vaccine together in one shot (combination vaccines). Talk with your health care provider about the risks and benefits of combination vaccines. What tests do I need? Blood tests  Lipid and cholesterol levels. These may be checked every 5 years, or more frequently if you are over 1 years old.  Hepatitis C test.  Hepatitis B test. Screening  Lung cancer screening. You  may have this screening every year starting at age 29 if you have a 30-pack-year history of smoking and currently smoke or have quit within the past 15 years.  Colorectal cancer screening. All adults should have this screening starting at age 84 and continuing until age 76. Your health care provider may recommend screening at age 58 if you are at increased risk. You will have tests every  1-10 years, depending on your results and the type of screening test.  Diabetes screening. This is done by checking your blood sugar (glucose) after you have not eaten for a while (fasting). You may have this done every 1-3 years.  Mammogram. This may be done every 1-2 years. Talk with your health care provider about when you should start having regular mammograms. This may depend on whether you have a family history of breast cancer.  BRCA-related cancer screening. This may be done if you have a family history of breast, ovarian, tubal, or peritoneal cancers.  Pelvic exam and Pap test. This may be done every 3 years starting at age 48. Starting at age 88, this may be done every 5 years if you have a Pap test in combination with an HPV test. Other tests  Sexually transmitted disease (STD) testing.  Bone density scan. This is done to screen for osteoporosis. You may have this scan if you are at high risk for osteoporosis. Follow these instructions at home: Eating and drinking  Eat a diet that includes fresh fruits and vegetables, whole grains, lean protein, and low-fat dairy.  Take vitamin and mineral supplements as recommended by your health care provider.  Do not drink alcohol if: ? Your health care provider tells you not to drink. ? You are pregnant, may be pregnant, or are planning to become pregnant.  If you drink alcohol: ? Limit how much you have to 0-1 drink a day. ? Be aware of how much alcohol is in your drink. In the [place] one drink equals one 12 oz bottle of beer (355 mL), one 5 oz glass of wine (148 mL), or one 1 oz glass of hard liquor (44 mL). Lifestyle  Take daily care of your teeth and gums.  Stay active. Exercise for at least 30 minutes on 5 or more days each week.  Do not use any products that contain nicotine or tobacco, such as cigarettes, e-cigarettes, and chewing tobacco. If you need help quitting, ask your health care provider.  If you are sexually active,  practice safe sex. Use a condom or other form of birth control (contraception) in order to prevent pregnancy and STIs (sexually transmitted infections).  If told by your health care provider, take low-dose aspirin daily starting at age 37. What's next?  Visit your health care provider once a year for a well check visit.  Ask your health care provider how often you should have your eyes and teeth checked.  Stay up to date on all vaccines. This information is not intended to replace advice given to you by your health care provider. Make sure you discuss any questions you have with your health care provider. Document Revised: 02/09/2018 Document Reviewed: 02/09/2018 Elsevier Patient Education  2020 [employer].

## 2020-05-20 NOTE — Progress Notes (Signed)
Subjective:    Patient ID: Kayla Wood, female    DOB: Oct 03, 1955, 64 y.o.   MRN: [id number]  HPI  This visit occurred during the SARS-CoV-2 public health emergency.  Safety protocols were in place, including screening questions prior to the visit, additional usage of staff PPE, and extensive cleaning of exam room while observing appropriate contact time as indicated for disinfecting solutions.   Kayla Wood is a 64 year old female who presents today for complete physical.  Immunizations: -Tetanus: Completed in 2020 -Influenza: Completed this season  -Shingles: Completed Shingrix series -Covid-19: Completed series  Diet: She endorses a fair diet.  Exercise: She is walking five mornings weekly.   Eye exam: Completes annually  Dental exam: Completes semi-annually   Pap Smear: Completed in 2018, due.  Mammogram: Completed in December 2020 and scheduled.  Colonoscopy: Completed in 2012, due in 2022 Hep C Screen:  BP Readings from Last 3 Encounters:  05/20/20 126/82  08/22/19 136/86  02/15/17 130/80   Wt Readings from Last 3 Encounters:  05/20/20 169 lb (76.7 kg)  08/22/19 178 lb 12 oz (81.1 kg)  02/15/17 154 lb 12.8 oz (70.2 kg)      Review of Systems  Constitutional: Negative for unexpected weight change.  HENT: Negative for rhinorrhea.   Eyes: Negative for visual disturbance.  Respiratory: Negative for cough and shortness of breath.   Cardiovascular: Negative for chest pain.  Gastrointestinal: Negative for constipation and diarrhea.  Genitourinary: Negative for difficulty urinating.  Musculoskeletal: Negative for arthralgias and myalgias.  Skin: Negative for rash.  Allergic/Immunologic: Positive for environmental allergies.  Neurological: Negative for dizziness, numbness and headaches.  Psychiatric/Behavioral: The patient is not nervous/anxious.        Past Medical History:  Diagnosis Date  . History of kidney stones   . History of UTI      Social History    Socioeconomic History  . Marital status: Married    Spouse name: Not on file  . Number of children: 3  . Years of education: Not on file  . Highest education level: Not on file  Occupational History  . Not on file  Tobacco Use  . Smoking status: Former Games developer  . Smokeless tobacco: Never Used  Substance and Sexual Activity  . Alcohol use: Yes  . Drug use: No  . Sexual activity: Not on file  Other Topics Concern  . Not on file  Social History Narrative   Married.   3 children.   Works for [employer].   Enjoys music, exercising, reading.    Social Determinants of Health   Financial Resource Strain:   . Difficulty of Paying Living Expenses: Not on file  Food Insecurity:   . Worried About Programme researcher, broadcasting/film/video in the Last Year: Not on file  . Ran Out of Food in the Last Year: Not on file  Transportation Needs:   . Lack of Transportation (Medical): Not on file  . Lack of Transportation (Non-Medical): Not on file  Physical Activity:   . Days of Exercise per Week: Not on file  . Minutes of Exercise per Session: Not on file  Stress:   . Feeling of Stress : Not on file  Social Connections:   . Frequency of Communication with Friends and Family: Not on file  . Frequency of Social Gatherings with Friends and Family: Not on file  . Attends Religious Services: Not on file  . Active Member of Clubs or Organizations: Not on file  .  Attends Banker Meetings: Not on file  . Marital Status: Not on file  Intimate Partner Violence:   . Fear of Current or Ex-Partner: Not on file  . Emotionally Abused: Not on file  . Physically Abused: Not on file  . Sexually Abused: Not on file    Past Surgical History:  Procedure Laterality Date  . BREAST BIOPSY Left 2016  . CESAREAN SECTION  1988  . FOOT SURGERY Bilateral 2014  . HAND SURGERY Right 03/07/2020    Family History  Problem Relation Age of Onset  . Heart disease Father   . Breast cancer Sister   . Irritable bowel  syndrome Sister   . Alcohol abuse Maternal Uncle   . Alcohol abuse Maternal Uncle     No Known Allergies  Current Outpatient Medications on File Prior to Visit  Medication Sig Dispense Refill  . ibuprofen (ADVIL,MOTRIN) 200 MG tablet Take 200 mg by mouth every 8 (eight) hours as needed.    . Multiple Vitamin (MULTI-VITAMIN) tablet Take by mouth.     No current facility-administered medications on file prior to visit.    BP 126/82   Pulse 68   Temp (!) 97.2 F (36.2 C) (Temporal)   Ht 5' 7.5" (1.715 m)   Wt 169 lb (76.7 kg)   SpO2 97%   BMI 26.08 kg/m    Objective:   Physical Exam HENT:     Right Ear: Tympanic membrane and ear canal normal.     Left Ear: Tympanic membrane and ear canal normal.  Eyes:     Pupils: Pupils are equal, round, and reactive to light.  Cardiovascular:     Rate and Rhythm: Normal rate and regular rhythm.  Pulmonary:     Effort: Pulmonary effort is normal.     Breath sounds: Normal breath sounds.  Abdominal:     General: Bowel sounds are normal.     Palpations: Abdomen is soft.     Tenderness: There is no abdominal tenderness.  Genitourinary:    Labia:        Right: No tenderness or lesion.        Left: No tenderness or lesion.      Vagina: No vaginal discharge, erythema or tenderness.     Cervix: No cervical motion tenderness, discharge, lesion or cervical bleeding.     Uterus: Normal.      Adnexa: Right adnexa normal and left adnexa normal.  Musculoskeletal:        General: Normal range of motion.     Cervical back: Neck supple.  Skin:    General: Skin is warm and dry.  Neurological:     Mental Status: She is alert and oriented to person, place, and time.     Cranial Nerves: No cranial nerve deficit.     Deep Tendon Reflexes:     Reflex Scores:      Patellar reflexes are 2+ on the right side and 2+ on the left side. Psychiatric:        Mood and Affect: Mood normal.            Assessment & Plan:

## 2020-05-20 NOTE — Assessment & Plan Note (Signed)
Immunizations UTD, she will send [place] the dates of her Shingrix vaccines. Pap smear due, completed today. Mammogram scheduled for this month.  Discussed the importance of a healthy diet and regular exercise in order for weight loss, and to reduce the risk of any potential medical problems.  Exam today unremarkable. Labs completed recently with her employer, she will scan a copy onto her My Chart portal.

## 2020-05-21 LAB — CYTOLOGY - PAP
Comment: NEGATIVE
Diagnosis: NEGATIVE
High risk HPV: NEGATIVE

## 2020-06-12 DIAGNOSIS — Z1231 Encounter for screening mammogram for malignant neoplasm of breast: Secondary | ICD-10-CM | POA: Diagnosis not present

## 2020-06-24 ENCOUNTER — Encounter: Payer: Self-pay | Admitting: Gastroenterology

## 2020-07-24 DIAGNOSIS — R937 Abnormal findings on diagnostic imaging of other parts of musculoskeletal system: Secondary | ICD-10-CM | POA: Diagnosis not present

## 2020-07-24 DIAGNOSIS — M79671 Pain in right foot: Secondary | ICD-10-CM | POA: Diagnosis not present

## 2020-07-24 DIAGNOSIS — M20091 Other deformity of right finger(s): Secondary | ICD-10-CM | POA: Diagnosis not present

## 2020-07-24 DIAGNOSIS — M2041 Other hammer toe(s) (acquired), right foot: Secondary | ICD-10-CM | POA: Diagnosis not present

## 2020-07-30 ENCOUNTER — Other Ambulatory Visit: Payer: Self-pay | Admitting: Obstetrics & Gynecology

## 2020-07-30 DIAGNOSIS — R5381 Other malaise: Secondary | ICD-10-CM

## 2020-08-06 ENCOUNTER — Other Ambulatory Visit: Payer: Self-pay | Admitting: Obstetrics & Gynecology

## 2020-08-06 DIAGNOSIS — R937 Abnormal findings on diagnostic imaging of other parts of musculoskeletal system: Secondary | ICD-10-CM

## 2020-08-08 ENCOUNTER — Other Ambulatory Visit: Payer: Self-pay | Admitting: Obstetrics & Gynecology

## 2020-08-08 DIAGNOSIS — R5381 Other malaise: Secondary | ICD-10-CM

## 2020-08-11 ENCOUNTER — Other Ambulatory Visit: Payer: Self-pay | Admitting: Obstetrics & Gynecology

## 2020-08-11 DIAGNOSIS — Z13820 Encounter for screening for osteoporosis: Secondary | ICD-10-CM

## 2020-08-29 ENCOUNTER — Encounter: Payer: BC Managed Care – PPO | Admitting: Gastroenterology

## 2020-09-08 DIAGNOSIS — L814 Other melanin hyperpigmentation: Secondary | ICD-10-CM | POA: Diagnosis not present

## 2020-09-08 DIAGNOSIS — D1801 Hemangioma of skin and subcutaneous tissue: Secondary | ICD-10-CM | POA: Diagnosis not present

## 2020-09-08 DIAGNOSIS — L718 Other rosacea: Secondary | ICD-10-CM | POA: Diagnosis not present

## 2021-01-21 ENCOUNTER — Other Ambulatory Visit: Payer: BC Managed Care – PPO

## 2021-07-03 LAB — HM MAMMOGRAPHY

## 2021-07-07 ENCOUNTER — Encounter: Payer: Self-pay | Admitting: Primary Care

## 2021-07-09 ENCOUNTER — Other Ambulatory Visit: Payer: Self-pay

## 2021-12-02 IMAGING — CT CT RENAL STONE PROTOCOL
1 of 2 series · 15 of 32 positions shown, 19 images · non-contrast
Comparison: None.

CLINICAL DATA: Left-sided flank pain x2 weeks.

EXAM:
CT ABDOMEN AND PELVIS WITHOUT CONTRAST
TECHNIQUE: Multidetector CT imaging of the abdomen and pelvis was performed
following the standard protocol without IV contrast.

[Series 2: axial st · axial · 0.68mm/px · z∈[-979,-604]mm · 15 of 83 slices shown, 19 images]
[im 4/83  soft-tissue]
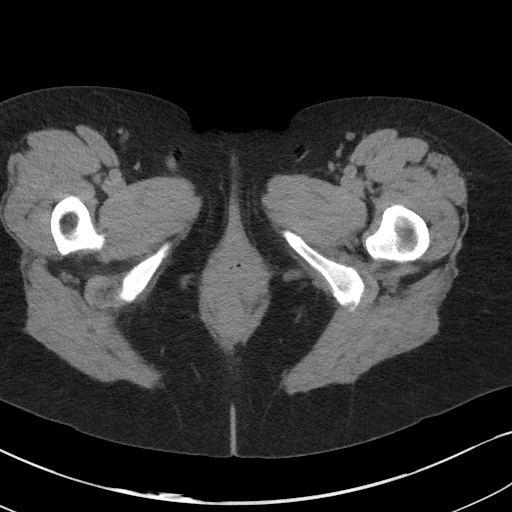
[im 4/83  bone]
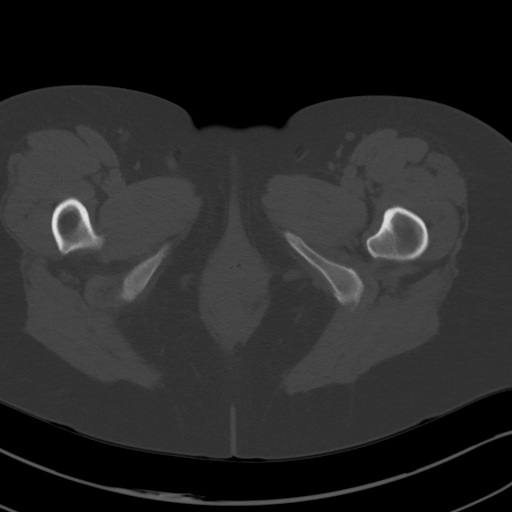
[im 10/83  soft-tissue]
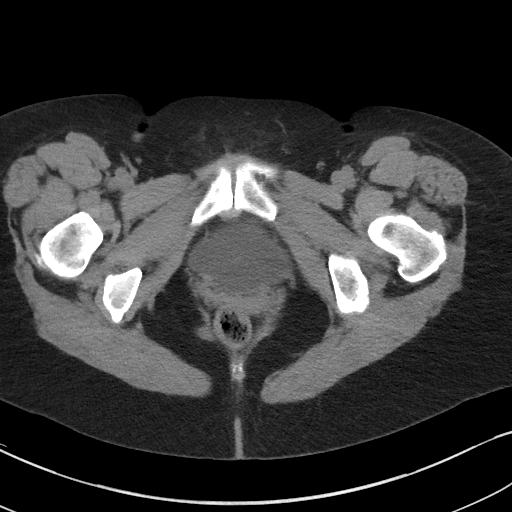
[im 17/83  soft-tissue]
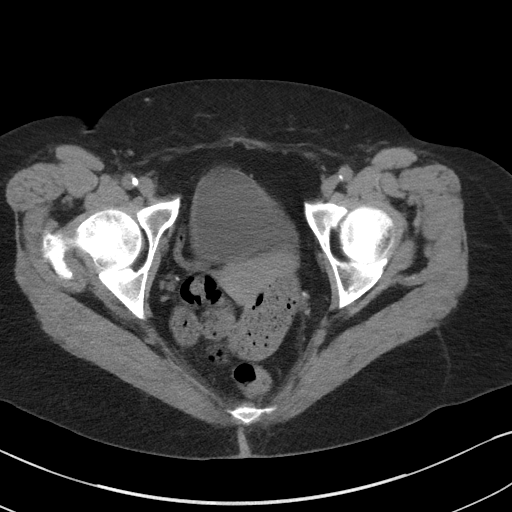
[im 23/83  soft-tissue]
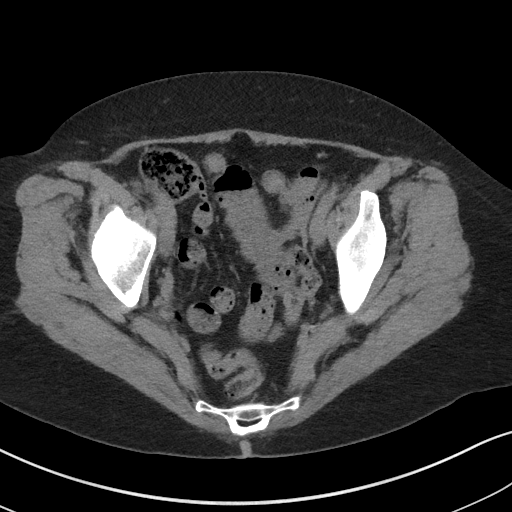
[im 30/83  soft-tissue]
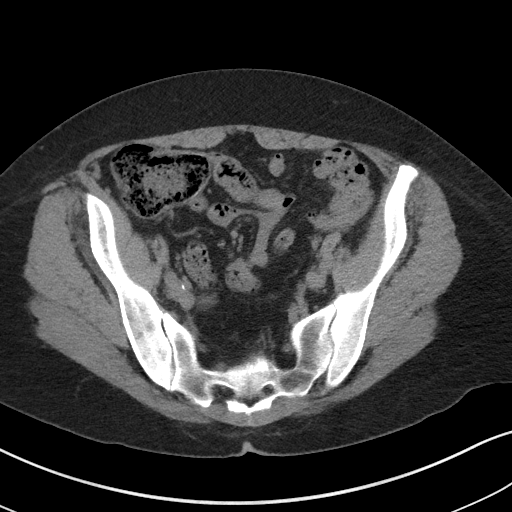
[im 37/83  soft-tissue]
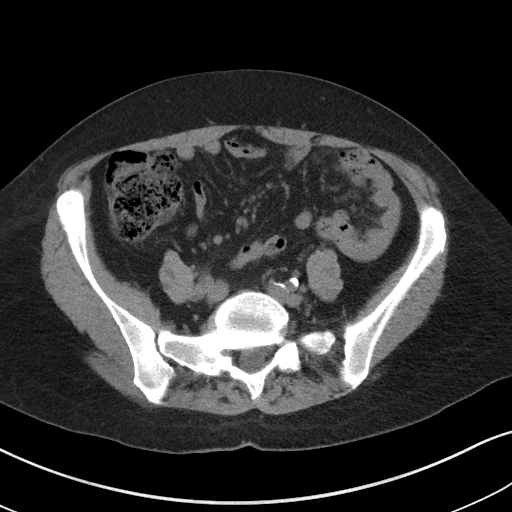
[im 43/83  soft-tissue]
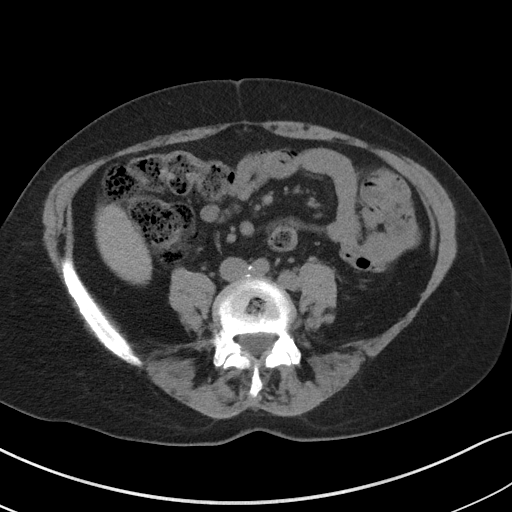
[im 46/83  soft-tissue]
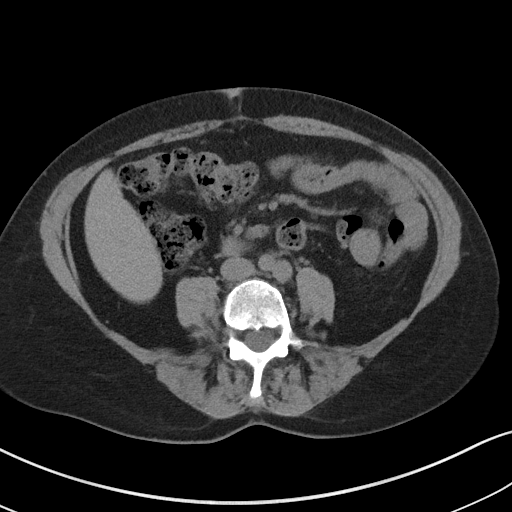
[im 53/83  soft-tissue]
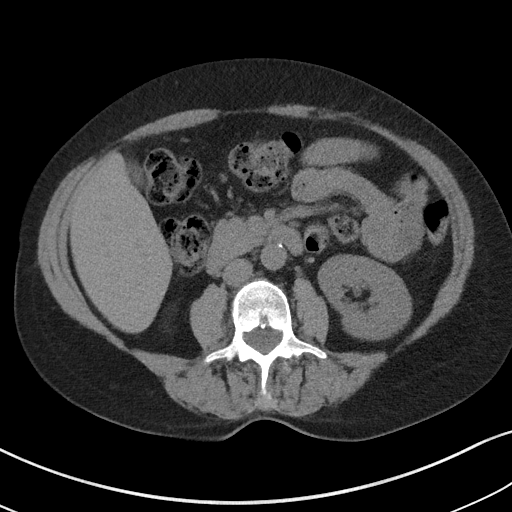
[im 53/83  bone]
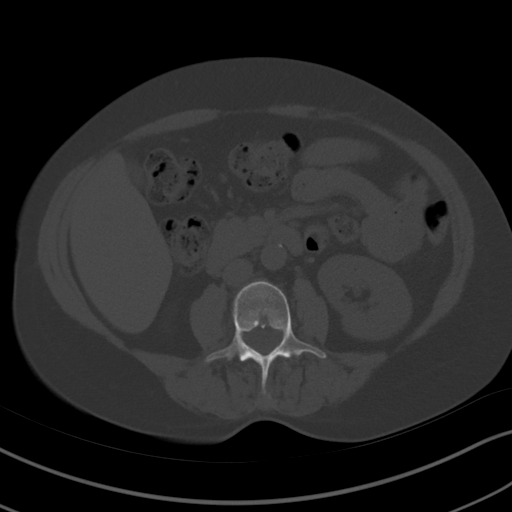
[im 60/83  soft-tissue]
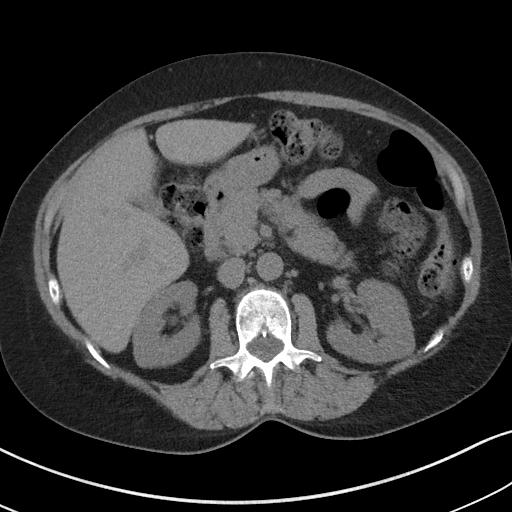
[im 66/83  soft-tissue]
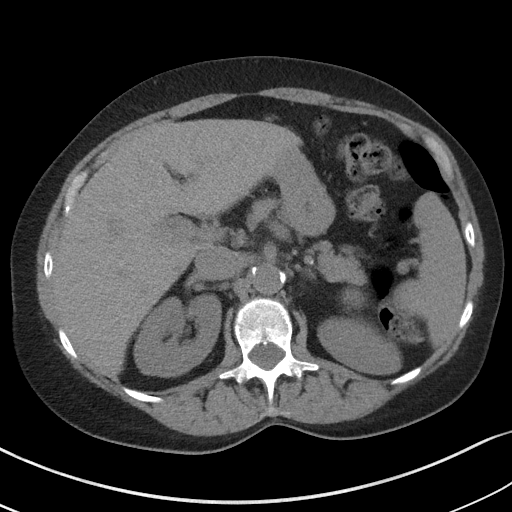
[im 69/83  lung]
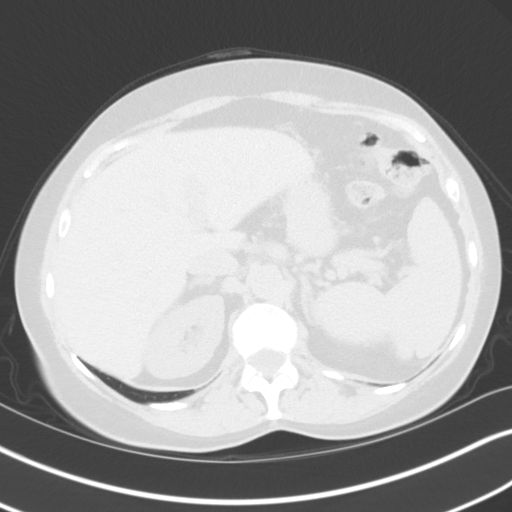
[im 73/83  soft-tissue]
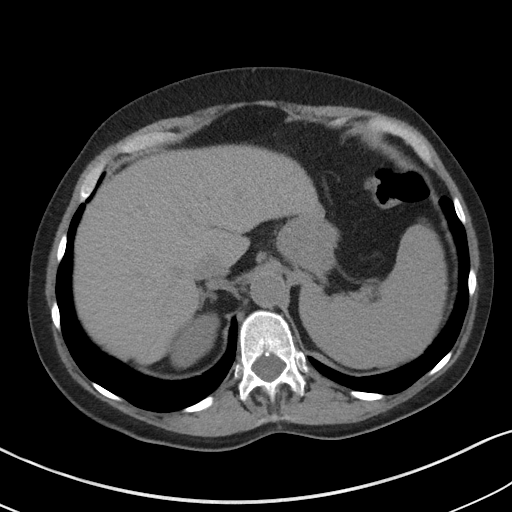
[im 73/83  lung]
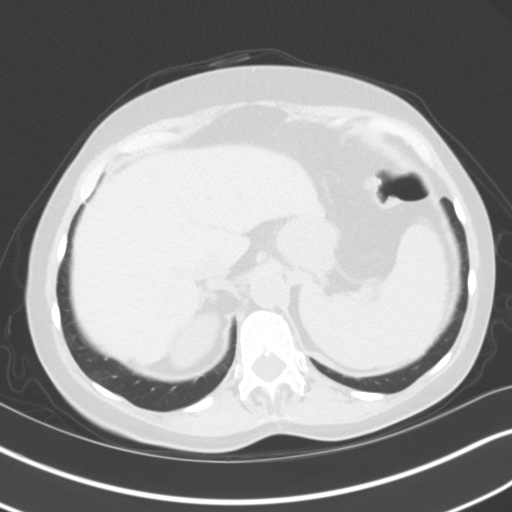
[im 76/83  lung]
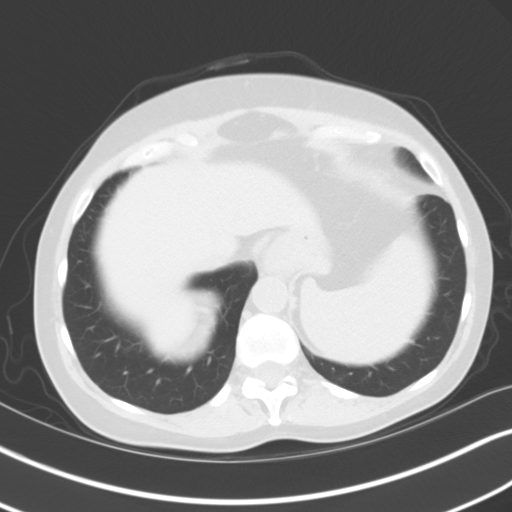
[im 79/83  soft-tissue]
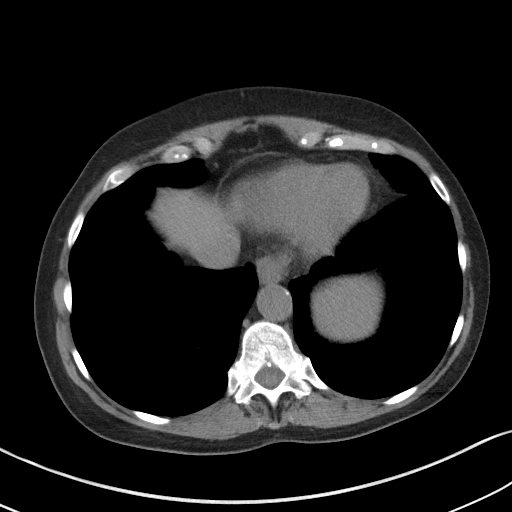
[im 79/83  lung]
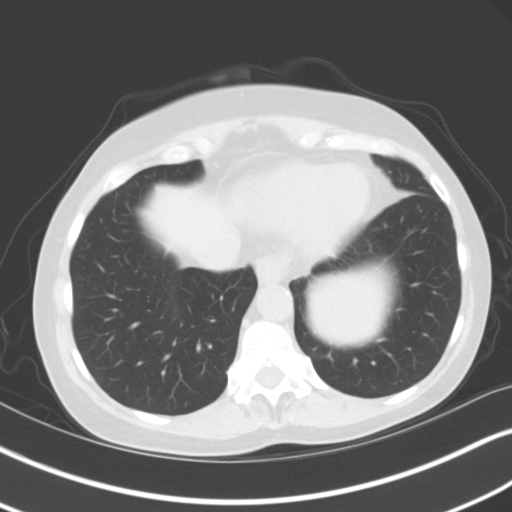

[15 of 32 positions shown; findings below may reference images not displayed]

FINDINGS: Lower chest: No acute abnormality.

Hepatobiliary: No focal liver abnormality is seen. No gallstones,
gallbladder wall thickening, or biliary dilatation.

Pancreas: Unremarkable. No pancreatic ductal dilatation or
surrounding inflammatory changes.

Spleen: The spleen is mildly enlarged.

Adrenals/Urinary Tract: Adrenal glands are unremarkable. Kidneys are
normal in size. A 6 mm focus of low attenuation is seen within the
anterior aspect of the mid right kidney. An extrarenal pelvis is
seen on the left. A 2 mm nonobstructing renal stone is seen within
the lower pole of the left kidney. There is no evidence of
hydronephrosis. Bladder is unremarkable.

Stomach/Bowel: There is a small hiatal hernia. Appendix appears
normal. No evidence of bowel wall thickening, distention, or
inflammatory changes. Noninflamed diverticula are seen throughout
the large bowel.

Vascular/Lymphatic: Mild aortic calcification. No enlarged abdominal
or pelvic lymph nodes.

Reproductive: Uterus and bilateral adnexa are unremarkable.

Other: No abdominal wall hernia or abnormality. No abdominopelvic
ascites.

Musculoskeletal: No acute or significant osseous findings.
IMPRESSION: 1. Mild splenomegaly.
2. 2 mm nonobstructing renal stone within the lower pole of the left
kidney.
3. Small hiatal hernia.
4. Colonic diverticulosis.

Aortic Atherosclerosis ([id number]).

## 2022-08-09 LAB — HM MAMMOGRAPHY

## 2022-09-09 ENCOUNTER — Encounter: Payer: Self-pay | Admitting: Primary Care

## 2022-09-24 ENCOUNTER — Encounter: Payer: Self-pay | Admitting: Primary Care

## 2022-09-24 ENCOUNTER — Ambulatory Visit (INDEPENDENT_AMBULATORY_CARE_PROVIDER_SITE_OTHER): Payer: Medicare Other | Admitting: Primary Care

## 2022-09-24 VITALS — BP 142/78 | HR 79 | Temp 97.7°F | Ht 67.0 in | Wt 179.0 lb

## 2022-09-24 DIAGNOSIS — E785 Hyperlipidemia, unspecified: Secondary | ICD-10-CM | POA: Insufficient documentation

## 2022-09-24 DIAGNOSIS — Z0000 Encounter for general adult medical examination without abnormal findings: Secondary | ICD-10-CM

## 2022-09-24 DIAGNOSIS — E2839 Other primary ovarian failure: Secondary | ICD-10-CM

## 2022-09-24 DIAGNOSIS — Z1211 Encounter for screening for malignant neoplasm of colon: Secondary | ICD-10-CM

## 2022-09-24 DIAGNOSIS — Z23 Encounter for immunization: Secondary | ICD-10-CM

## 2022-09-24 DIAGNOSIS — L719 Rosacea, unspecified: Secondary | ICD-10-CM

## 2022-09-24 LAB — COMPREHENSIVE METABOLIC PANEL
ALT: 21 U/L (ref 0–35)
AST: 23 U/L (ref 0–37)
Albumin: 4.7 g/dL (ref 3.5–5.2)
Alkaline Phosphatase: 75 U/L (ref 39–117)
BUN: 15 mg/dL (ref 6–23)
CO2: 25 mEq/L (ref 19–32)
Calcium: 9.7 mg/dL (ref 8.4–10.5)
Chloride: 103 mEq/L (ref 96–112)
Creatinine, Ser: 0.72 mg/dL (ref 0.40–1.20)
GFR: 86.8 mL/min (ref >60.00)
Glucose, Bld: 90 mg/dL (ref 70–99)
Potassium: 4.3 mEq/L (ref 3.5–5.1)
Sodium: 138 mEq/L (ref 135–145)
Total Bilirubin: 1.1 mg/dL (ref 0.2–1.2)
Total Protein: 7.8 g/dL (ref 6.0–8.3)

## 2022-09-24 LAB — CBC
HCT: 42.2 % (ref 36.0–46.0)
Hemoglobin: 14.5 g/dL (ref 12.0–15.0)
MCHC: 34.4 g/dL (ref 30.0–36.0)
MCV: 85.2 fl (ref 78.0–100.0)
Platelets: 199 10*3/uL (ref 150.0–400.0)
RBC: 4.95 Mil/uL (ref 3.87–5.11)
RDW: 13.1 % (ref 11.5–15.5)
WBC: 5.1 10*3/uL (ref 4.0–10.5)

## 2022-09-24 LAB — LIPID PANEL
Cholesterol: 194 mg/dL (ref 0–200)
HDL: 44.7 mg/dL (ref >39.00)
LDL Cholesterol: 129 mg/dL — ABNORMAL HIGH (ref 0–99)
NonHDL: 149.47
Total CHOL/HDL Ratio: 4
Triglycerides: 104 mg/dL (ref 0.0–149.0)
VLDL: 20.8 mg/dL (ref 0.0–40.0)

## 2022-09-24 NOTE — Assessment & Plan Note (Signed)
Prevnar 20 provided today. Mammogram UTD. Bone density scan due, orders placed Colonoscopy due, referral placed to GI  Discussed the importance of a healthy diet and regular exercise in order for weight loss, and to reduce the risk of further co-morbidity.  Exam stable. Labs pending.  Follow up in 1 year for repeat physical.

## 2022-09-24 NOTE — Assessment & Plan Note (Signed)
Following with dermatology. ° °Continue compounded cream.  °

## 2022-09-24 NOTE — Patient Instructions (Signed)
Stop by the lab prior to leaving today. I will notify you of your results once received.   [facility] density scan.   You will either be contacted via phone regarding your referral to GI for the colonoscopy, or you may receive a letter on your MyChart portal from our referral team with instructions for scheduling an appointment. Please let us know if you have not been contacted by anyone within two weeks.  It was a pleasure to see you today!

## 2022-09-24 NOTE — Assessment & Plan Note (Signed)
Repeat lipid panel pending. 

## 2022-09-24 NOTE — Progress Notes (Signed)
Subjective:    Patient ID: Kayla Wood, female    DOB: 02/26/1956, 67 y.o.   MRN: [id number]  HPI  Kayla Wood is a very pleasant 67 y.o. female who presents today for follow up of chronic conditions.  Immunizations: -Tetanus: Completed in 2020 -Shingles: Completed Shingrix series -Pneumonia: Never completed, due today.  Mammogram: February 2024 Bone Density Scan: Never completed   Colonoscopy: Completed in 2012, due 2022 and did not complete.   BP Readings from Last 3 Encounters:  09/24/22 (!) 142/80  05/20/20 126/82  08/22/19 136/86      Review of Systems  Constitutional:  Negative for unexpected weight change.  HENT:  Negative for rhinorrhea.   Respiratory:  Negative for cough and shortness of breath.   Cardiovascular:  Negative for chest pain.  Gastrointestinal:  Negative for constipation and diarrhea.  Genitourinary:  Negative for difficulty urinating.  Musculoskeletal:  Negative for arthralgias and myalgias.  Skin:  Negative for rash.  Allergic/Immunologic: Negative for environmental allergies.  Neurological:  Negative for dizziness and headaches.  Psychiatric/Behavioral:  The patient is not nervous/anxious.          Past Medical History:  Diagnosis Date   History of kidney stones    History of UTI     Social History   Socioeconomic History   Marital status: Married    Spouse name: Not on file   Number of children: 3   Years of education: Not on file   Highest education level: Not on file  Occupational History   Not on file  Tobacco Use   Smoking status: Former   Smokeless tobacco: Never  Substance and Sexual Activity   Alcohol use: Yes   Drug use: No   Sexual activity: Not on file  Other Topics Concern   Not on file  Social History Narrative   Married.   3 children.   Works for [employer].   Enjoys music, exercising, reading.    Social Determinants of Health   Financial Resource Strain: Not on file  Food Insecurity: Not on file   Transportation Needs: Not on file  Physical Activity: Not on file  Stress: Not on file  Social Connections: Not on file  Intimate Partner Violence: Not on file    Past Surgical History:  Procedure Laterality Date   BREAST BIOPSY Left 2016   CESAREAN SECTION  1988   FOOT SURGERY Bilateral 2014   HAND SURGERY Right 03/07/2020    Family History  Problem Relation Age of Onset   Heart disease Father    Breast cancer Sister    Irritable bowel syndrome Sister    Alcohol abuse Maternal Uncle    Alcohol abuse Maternal Uncle     No Known Allergies  Current Outpatient Medications on File Prior to Visit  Medication Sig Dispense Refill   calcium carbonate (SUPER CALCIUM) 1500 (600 Ca) MG TABS tablet 1 tablet with meals Orally Twice a day for 30 day(s)     Cholecalciferol 25 MCG (1000 UT) capsule Take by mouth.     ibuprofen (ADVIL,MOTRIN) 200 MG tablet Take 200 mg by mouth every 8 (eight) hours as needed.     Multiple Vitamin (MULTI-VITAMIN) tablet Take by mouth.     No current facility-administered medications on file prior to visit.    BP (!) 142/80   Pulse 79   Temp 97.7 F (36.5 C) (Temporal)   Ht  (1.702 m)   Wt 179 lb (81.2 kg)   SpO2 98%  BMI 28.04 kg/m  Objective:   Physical Exam HENT:     Right Ear: Tympanic membrane and ear canal normal.     Left Ear: Tympanic membrane and ear canal normal.     Nose: Nose normal.  Eyes:     Conjunctiva/sclera: Conjunctivae normal.     Pupils: Pupils are equal, round, and reactive to light.  Neck:     Thyroid: No thyromegaly.  Cardiovascular:     Rate and Rhythm: Normal rate and regular rhythm.     Heart sounds: No murmur heard. Pulmonary:     Effort: Pulmonary effort is normal.     Breath sounds: Normal breath sounds. No rales.  Abdominal:     General: Bowel sounds are normal.     Palpations: Abdomen is soft.     Tenderness: There is no abdominal tenderness.  Musculoskeletal:        General: Normal range of  motion.     Cervical back: Neck supple.  Lymphadenopathy:     Cervical: No cervical adenopathy.  Skin:    General: Skin is warm and dry.     Findings: No rash.  Neurological:     Mental Status: She is alert and oriented to person, place, and time.     Cranial Nerves: No cranial nerve deficit.     Deep Tendon Reflexes: Reflexes are normal and symmetric.  Psychiatric:        Mood and Affect: Mood normal.           Assessment & Plan:  Preventative health care Assessment & Plan: Prevnar 20 provided today. Mammogram UTD. Bone density scan due, orders placed Colonoscopy due, referral placed to GI  Discussed the importance of a healthy diet and regular exercise in order for weight loss, and to reduce the risk of further co-morbidity.  Exam stable. Labs pending.  Follow up in 1 year for repeat physical.    Rosacea Assessment & Plan: Following with dermatology. Continue compounded cream.    Hyperlipidemia, unspecified hyperlipidemia type Assessment & Plan: Repeat lipid panel pending.  Orders: -     Lipid panel -     Comprehensive metabolic panel -     CBC  Estrogen deficiency -     DG Bone Density; Future  Screening for colon cancer -     Ambulatory referral to Gastroenterology  Other orders -     Pneumococcal conjugate vaccine 20-valent        Doreene Nest, NP

## 2022-11-24 ENCOUNTER — Ambulatory Visit
Admission: RE | Admit: 2022-11-24 | Discharge: 2022-11-24 | Disposition: A | Discharge status: Home / Self Care | Payer: Medicare Other | Source: Ambulatory Visit | Attending: Primary Care | Admitting: Primary Care

## 2022-11-24 DIAGNOSIS — E2839 Other primary ovarian failure: Secondary | ICD-10-CM | POA: Diagnosis present

## 2022-11-30 ENCOUNTER — Ambulatory Visit (AMBULATORY_SURGERY_CENTER): Payer: Medicare Other

## 2022-11-30 VITALS — Ht 67.0 in | Wt 177.0 lb

## 2022-11-30 DIAGNOSIS — Z1211 Encounter for screening for malignant neoplasm of colon: Secondary | ICD-10-CM

## 2022-11-30 MED ORDER — PEG 3350-KCL-NA BICARB-NACL 420 G PO SOLR: 4000.0000 mL | Freq: Once | ORAL | 0 refills | Status: AC

## 2022-11-30 NOTE — Progress Notes (Signed)
No egg or soy allergy known to patient  No issues known to pt with past sedation with any surgeries or procedures Patient denies ever being told they had issues or difficulty with intubation  No FH of Malignant Hyperthermia Pt is not on diet pills Pt is not on  home 02  Pt is not on blood thinners  Pt denies issues with constipation  No A fib or A flutter Have any cardiac testing pending--no  LOA: independent   Patient's chart reviewed by Cathlyn Parsons CNRA prior to previsit and patient appropriate for the [facility].  Previsit completed and red dot placed by patient's name on their procedure day (on provider's schedule).     PV completed. Prep instructions reviewed and sent to via mychart / home address.

## 2022-12-27 ENCOUNTER — Ambulatory Visit: Payer: Medicare Other

## 2022-12-27 VITALS — Ht 66.0 in | Wt 177.0 lb

## 2022-12-27 DIAGNOSIS — Z0000 Encounter for general adult medical examination without abnormal findings: Secondary | ICD-10-CM | POA: Diagnosis not present

## 2022-12-27 NOTE — Patient Instructions (Signed)
Kayla Wood , Thank you for taking time to come for your Medicare Wellness Visit. I appreciate your ongoing commitment to your health goals. Please review the following plan we discussed and let me know if I can assist you in the future.   These are the goals we discussed:  Goals      Patient Stated     Lose 20 pounds.        This is a list of the screening recommended for you and due dates:  Health Maintenance  Topic Date Due   Medicare Annual Wellness Visit  Never done   Colon Cancer Screening  02/16/2021   COVID-19 Vaccine (4 - 2023-24 season) 02/12/2022   Flu Shot  01/13/2023   Mammogram  08/09/2024   DTaP/Tdap/Td vaccine (2 - Td or Tdap) 08/29/2028   Pneumonia Vaccine  Completed   DEXA scan (bone density measurement)  Completed   Hepatitis C Screening  Completed   Zoster (Shingles) Vaccine  Completed   HPV Vaccine  Aged Out    Advanced directives: Please bring a copy of your health care power of attorney and living will to the office to be added to your chart at your convenience.   Conditions/risks identified: none  Next appointment: Follow up in one year for your annual wellness visit 12/28/23 @ 8:15 televisit   Preventive Care 65 Years and Older, Female Preventive care refers to lifestyle choices and visits with your health care provider that can promote health and wellness. What does preventive care include? A yearly physical exam. This is also called an annual well check. Dental exams once or twice a year. Routine eye exams. Ask your health care provider how often you should have your eyes checked. Personal lifestyle choices, including: Daily care of your teeth and gums. Regular physical activity. Eating a healthy diet. Avoiding tobacco and drug use. Limiting alcohol use. Practicing safe sex. Taking low-dose aspirin every day. Taking vitamin and mineral supplements as recommended by your health care provider. What happens during an annual well check? The  services and screenings done by your health care provider during your annual well check will depend on your age, overall health, lifestyle risk factors, and family history of disease. Counseling  Your health care provider may ask you questions about your: Alcohol use. Tobacco use. Drug use. Emotional well-being. Home and relationship well-being. Sexual activity. Eating habits. History of falls. Memory and ability to understand (cognition). Work and work Astronomer. Reproductive health. Screening  You may have the following tests or measurements: Height, weight, and BMI. Blood pressure. Lipid and cholesterol levels. These may be checked every 5 years, or more frequently if you are over 35 years old. Skin check. Lung cancer screening. You may have this screening every year starting at age 65 if you have a 30-pack-year history of smoking and currently smoke or have quit within the past 15 years. Fecal occult blood test (FOBT) of the stool. You may have this test every year starting at age 42. Flexible sigmoidoscopy or colonoscopy. You may have a sigmoidoscopy every 5 years or a colonoscopy every 10 years starting at age 44. Hepatitis C blood test. Hepatitis B blood test. Sexually transmitted disease (STD) testing. Diabetes screening. This is done by checking your blood sugar (glucose) after you have not eaten for a while (fasting). You may have this done every 1-3 years. Bone density scan. This is done to screen for osteoporosis. You may have this done starting at age 41. Mammogram. This may  be done every 1-2 years. Talk to your health care provider about how often you should have regular mammograms. Talk with your health care provider about your test results, treatment options, and if necessary, the need for more tests. Vaccines  Your health care provider may recommend certain vaccines, such as: Influenza vaccine. This is recommended every year. Tetanus, diphtheria, and acellular  pertussis (Tdap, Td) vaccine. You may need a Td booster every 10 years. Zoster vaccine. You may need this after age 65. Pneumococcal 13-valent conjugate (PCV13) vaccine. One dose is recommended after age 54. Pneumococcal polysaccharide (PPSV23) vaccine. One dose is recommended after age 65. Talk to your health care provider about which screenings and vaccines you need and how often you need them. This information is not intended to replace advice given to you by your health care provider. Make sure you discuss any questions you have with your health care provider. Document Released: 06/27/2015 Document Revised: 02/18/2016 Document Reviewed: 04/01/2015 Elsevier Interactive Patient Education  2017 [employer].  Fall Prevention in the Home Falls can cause injuries. They can happen to people of all ages. There are many things you can do to make your home safe and to help prevent falls. What can I do on the outside of my home? Regularly fix the edges of walkways and driveways and fix any cracks. Remove anything that might make you trip as you walk through a door, such as a raised step or threshold. Trim any bushes or trees on the path to your home. Use bright outdoor lighting. Clear any walking paths of anything that might make someone trip, such as rocks or tools. Regularly check to see if handrails are loose or broken. Make sure that both sides of any steps have handrails. Any raised decks and porches should have guardrails on the edges. Have any leaves, snow, or ice cleared regularly. Use sand or salt on walking paths during winter. Clean up any spills in your garage right away. This includes oil or grease spills. What can I do in the bathroom? Use night lights. Install grab bars by the toilet and in the tub and shower. Do not use towel bars as grab bars. Use non-skid mats or decals in the tub or shower. If you need to sit down in the shower, use a plastic, non-slip stool. Keep the floor  dry. Clean up any water that spills on the floor as soon as it happens. Remove soap buildup in the tub or shower regularly. Attach bath mats securely with double-sided non-slip rug tape. Do not have throw rugs and other things on the floor that can make you trip. What can I do in the bedroom? Use night lights. Make sure that you have a light by your bed that is easy to reach. Do not use any sheets or blankets that are too big for your bed. They should not hang down onto the floor. Have a firm chair that has side arms. You can use this for support while you get dressed. Do not have throw rugs and other things on the floor that can make you trip. What can I do in the kitchen? Clean up any spills right away. Avoid walking on wet floors. Keep items that you use a lot in easy-to-reach places. If you need to reach something above you, use a strong step stool that has a grab bar. Keep electrical cords out of the way. Do not use floor polish or wax that makes floors slippery. If you must use  wax, use non-skid floor wax. Do not have throw rugs and other things on the floor that can make you trip. What can I do with my stairs? Do not leave any items on the stairs. Make sure that there are handrails on both sides of the stairs and use them. Fix handrails that are broken or loose. Make sure that handrails are as long as the stairways. Check any carpeting to make sure that it is firmly attached to the stairs. Fix any carpet that is loose or worn. Avoid having throw rugs at the top or bottom of the stairs. If you do have throw rugs, attach them to the floor with carpet tape. Make sure that you have a light switch at the top of the stairs and the bottom of the stairs. If you do not have them, ask someone to add them for you. What else can I do to help prevent falls? Wear shoes that: Do not have high heels. Have rubber bottoms. Are comfortable and fit you well. Are closed at the toe. Do not wear  sandals. If you use a stepladder: Make sure that it is fully opened. Do not climb a closed stepladder. Make sure that both sides of the stepladder are locked into place. Ask someone to hold it for you, if possible. Clearly mark and make sure that you can see: Any grab bars or handrails. First and last steps. Where the edge of each step is. Use tools that help you move around (mobility aids) if they are needed. These include: Canes. Walkers. Scooters. Crutches. Turn on the lights when you go into a dark area. Replace any light bulbs as soon as they burn out. Set up your furniture so you have a clear path. Avoid moving your furniture around. If any of your floors are uneven, fix them. If there are any pets around you, be aware of where they are. Review your medicines with your doctor. Some medicines can make you feel dizzy. This can increase your chance of falling. Ask your doctor what other things that you can do to help prevent falls. This information is not intended to replace advice given to you by your health care provider. Make sure you discuss any questions you have with your health care provider. Document Released: 03/27/2009 Document Revised: 11/06/2015 Document Reviewed: 07/05/2014 Elsevier Interactive Patient Education  2017 [employer].

## 2022-12-27 NOTE — Progress Notes (Signed)
Subjective:   Kayla Wood is a 67 y.o. female who presents for an Initial Medicare Annual Wellness Visit.  Visit Complete: Virtual  I connected with  Kayla Wood on 12/27/22 by a audio enabled telemedicine application and verified that I am speaking with the correct person using two identifiers.  Patient Location: Home  Provider Location: Home Office  I discussed the limitations of evaluation and management by telemedicine. The patient expressed understanding and agreed to proceed.   Review of Systems      Cardiac Risk Factors include: advanced age (>70men, >36 women);dyslipidemia    Per patient no change in vitals since last visit, unable to obtain new vitals due to telehealth visit   Objective:    Today's Vitals   12/27/22 0809  Weight: 177 lb (80.3 kg)  Height: 5\' 6"  (1.676 m)   Body mass index is 28.57 kg/m.     12/27/2022    8:14 AM  Advanced Directives  Does Patient Have a Medical Advance Directive? Yes  Type of Estate agent of [place];Living will  Copy of Healthcare Power of Attorney in Chart? No - copy requested    Current Medications (verified) Outpatient Encounter Medications as of 12/27/2022  Medication Sig   Cholecalciferol 25 MCG (1000 UT) capsule Take by mouth.   ibuprofen (ADVIL,MOTRIN) 200 MG tablet Take 200 mg by mouth every 8 (eight) hours as needed.   Multiple Vitamin (MULTI-VITAMIN) tablet Take by mouth.   No facility-administered encounter medications on file as of 12/27/2022.    Allergies (verified) Patient has no known allergies.   History: Past Medical History:  Diagnosis Date   History of kidney stones    History of UTI    Past Surgical History:  Procedure Laterality Date   BREAST BIOPSY Left 2016   CESAREAN SECTION  1988   FOOT SURGERY Bilateral 2014   HAND SURGERY Right 03/07/2020   Family History  Problem Relation Age of Onset   Colon polyps Mother    Heart disease Father    Breast cancer Sister     Irritable bowel syndrome Sister    Alcohol abuse Maternal Uncle    Alcohol abuse Maternal Uncle    Colon cancer Neg Hx    Esophageal cancer Neg Hx    Rectal cancer Neg Hx    Stomach cancer Neg Hx    Social History   Socioeconomic History   Marital status: Married    Spouse name: Not on file   Number of children: 3   Years of education: Not on file   Highest education level: Not on file  Occupational History   Not on file  Tobacco Use   Smoking status: Former   Smokeless tobacco: Never  Vaping Use   Vaping status: Never Used  Substance and Sexual Activity   Alcohol use: Yes   Drug use: No   Sexual activity: Not on file  Other Topics Concern   Not on file  Social History Narrative   Married.   3 children.   Works for [employer].   Enjoys music, exercising, reading.    Social Determinants of Health   Financial Resource Strain: Low Risk  (12/27/2022)   Overall Financial Resource Strain (CARDIA)    Difficulty of Paying Living Expenses: Not hard at all  Food Insecurity: No Food Insecurity (12/27/2022)   Hunger Vital Sign    Worried About Running Out of Food in the Last Year: Never true    Ran Out of Food in  the Last Year: Never true  Transportation Needs: No Transportation Needs (12/27/2022)   PRAPARE - Administrator, Civil Service (Medical): No    Lack of Transportation (Non-Medical): No  Physical Activity: Sufficiently Active (12/27/2022)   Exercise Vital Sign    Days of Exercise per Week: 5 days    Minutes of Exercise per Session: 60 min  Stress: No Stress Concern Present (12/27/2022)   [employer] of Occupational Health - Occupational Stress Questionnaire    Feeling of Stress : Not at all  Social Connections: Socially Integrated (12/27/2022)   Social Connection and Isolation Panel [NHANES]    Frequency of Communication with Friends and Family: More than three times a week    Frequency of Social Gatherings with Friends and Family: More than three times a  week    Attends Religious Services: More than 4 times per year    Active Member of [employer] or Organizations: Yes    Attends Engineer, structural: More than 4 times per year    Marital Status: Married    Tobacco Counseling Counseling given: Not Answered   Clinical Intake:  Pre-visit preparation completed: Yes  Pain : No/denies pain     BMI - recorded: 28.57 Nutritional Status: BMI 25 -29 Overweight Nutritional Risks: None Diabetes: No  How often do you need to have someone help you when you read instructions, pamphlets, or other written materials from your doctor or pharmacy?: 1 - Never  Interpreter Needed?: No  Information entered by :: C.Letitia Sabala LPN   Activities of Daily Living    12/27/2022    8:15 AM 12/24/2022    7:48 AM  In your present state of health, do you have any difficulty performing the following activities:  Hearing? 0 0  Vision? 0 0  Difficulty concentrating or making decisions? 0 0  Walking or climbing stairs? 0 0  Dressing or bathing? 0 0  Doing errands, shopping? 0 0  Preparing Food and eating ? N N  Using the Toilet? N N  In the past six months, have you accidently leaked urine? N N  Do you have problems with loss of bowel control? N N  Managing your Medications? N N  Managing your Finances? N N  Housekeeping or managing your Housekeeping? N N    Patient Care Team: Doreene Nest, NP as PCP - General (Internal Medicine) Zenovia Jordan, MD as Consulting Physician (Rheumatology)  Indicate any recent Medical Services you may have received from other than [facility] providers in the past year (date may be approximate).     Assessment:   This is a routine wellness examination for [place].  Hearing/Vision screen Hearing Screening - Comments:: Denies hearing difficulties   Vision Screening - Comments:: Contacts and glasses - [facility] - UTD on eye exams  Dietary issues and exercise activities discussed:     Goals Addressed              This Visit's Progress    Patient Stated       Lose 20 pounds.       Depression Screen    12/27/2022    8:13 AM 09/24/2022   11:45 AM 05/20/2020    2:08 PM  PHQ 2/9 Scores  PHQ - 2 Score 0 0 0  PHQ- 9 Score   0    Fall Risk    12/27/2022    8:15 AM 12/24/2022    7:48 AM 09/24/2022   11:45 AM 05/20/2020  2:08 PM  Fall Risk   Falls in the past year? 0 0 0 0  Number falls in past yr: 0 0 0 0  Injury with Fall? 0  0 0  Risk for fall due to : No Fall Risks  No Fall Risks   Follow up Falls prevention discussed;Falls evaluation completed  Falls evaluation completed     MEDICARE RISK AT HOME:  Medicare Risk at Home - 12/27/22 0816     Any stairs in or around the home? Yes    If so, are there any without handrails? No    Home free of loose throw rugs in walkways, pet beds, electrical cords, etc? Yes    Adequate lighting in your home to reduce risk of falls? Yes    Life alert? No    Use of a cane, walker or w/c? No    Grab bars in the bathroom? No    Shower chair or bench in shower? Yes    Elevated toilet seat or a handicapped toilet? Yes             TIMED UP AND GO:  Was the test performed? No    Cognitive Function:        12/27/2022    8:17 AM  6CIT Screen  What Year? 0 points  What month? 0 points  What time? 0 points  Count back from 20 0 points  Months in reverse 0 points  Repeat phrase 0 points  Total Score 0 points    Immunizations Immunization History  Administered Date(s) Administered   Influenza-Unspecified 03/03/2020   PFIZER(Purple Top)SARS-COV-2 Vaccination 08/31/2019, 09/25/2019, 04/26/2020   PNEUMOCOCCAL CONJUGATE-20 09/24/2022   Tdap 08/30/2018   Zoster Recombinant(Shingrix) 05/10/2017, 09/02/2017    TDAP status: Up to date  Flu Vaccine status: Up to date  Pneumococcal vaccine status: Up to date  Covid-19 vaccine status: Information provided on how to obtain vaccines.   Qualifies for Shingles Vaccine? Yes   Zostavax  completed  unknown   Shingrix Completed?: Yes  Screening Tests Health Maintenance  Topic Date Due   Medicare Annual Wellness (AWV)  Never done   Colonoscopy  02/16/2021   COVID-19 Vaccine (4 - 2023-24 season) 02/12/2022   INFLUENZA VACCINE  01/13/2023   MAMMOGRAM  08/09/2024   DTaP/Tdap/Td (2 - Td or Tdap) 08/29/2028   Pneumonia Vaccine 31+ Years old  Completed   DEXA SCAN  Completed   Hepatitis C Screening  Completed   Zoster Vaccines- Shingrix  Completed   HPV VACCINES  Aged Out    Health Maintenance  Health Maintenance Due  Topic Date Due   Medicare Annual Wellness (AWV)  Never done   Colonoscopy  02/16/2021   COVID-19 Vaccine (4 - 2023-24 season) 02/12/2022    Colorectal cancer screening: Type of screening: Colonoscopy. Completed 01/23/17. Repeat every 5 years Per pt next colonoscopy scheduled for 01/26/23.  Mammogram status: Completed 07/20/22. Repeat every year  Bone Density status: Completed 11/24/22. Results reflect: Bone density results: NORMAL. Repeat every 5 years.  Lung Cancer Screening: (Low Dose CT Chest recommended if Age 26-80 years, 20 pack-year currently smoking OR have quit w/in 15years.) does not qualify.   Lung Cancer Screening Referral: n/a  Additional Screening:  Hepatitis C Screening: does qualify; Completed 05/20/90  Vision Screening: Recommended annual ophthalmology exams for early detection of glaucoma and other disorders of the eye. Is the patient up to date with their annual eye exam?  Yes  Who is the provider or what  is the name of the office in which the patient attends annual eye exams? [facility] If pt is not established with a provider, would they like to be referred to a provider to establish care? Yes .   Dental Screening: Recommended annual dental exams for proper oral hygiene    Community Resource Referral / Chronic Care Management: CRR required this visit?  No   CCM required this visit?  No     Plan:     I have  personally reviewed and noted the following in the patient's chart:   Medical and social history Use of alcohol, tobacco or illicit drugs  Current medications and supplements including opioid prescriptions. Patient is not currently taking opioid prescriptions. Functional ability and status Nutritional status Physical activity Advanced directives List of other physicians Hospitalizations, surgeries, and ER visits in previous 12 months Vitals Screenings to include cognitive, depression, and falls Referrals and appointments  In addition, I have reviewed and discussed with patient certain preventive protocols, quality metrics, and best practice recommendations. A written personalized care plan for preventive services as well as general preventive health recommendations were provided to patient.     Maryan Puls, LPN   1/61/0960   After Visit Summary: (MyChart) Due to this being a telephonic visit, the after visit summary with patients personalized plan was offered to patient via MyChart   Nurse Notes: none

## 2022-12-29 ENCOUNTER — Encounter: Payer: Medicare Other | Admitting: Gastroenterology

## 2023-01-23 ENCOUNTER — Encounter: Payer: Self-pay | Admitting: Certified Registered Nurse Anesthetist

## 2023-01-26 ENCOUNTER — Ambulatory Visit: Payer: Medicare Other | Admitting: Gastroenterology

## 2023-01-26 ENCOUNTER — Encounter: Payer: Self-pay | Admitting: Gastroenterology

## 2023-01-26 VITALS — BP 141/82 | HR 65 | Temp 98.0°F | Resp 17 | Ht 67.0 in | Wt 177.0 lb

## 2023-01-26 DIAGNOSIS — Z1211 Encounter for screening for malignant neoplasm of colon: Secondary | ICD-10-CM

## 2023-01-26 DIAGNOSIS — D120 Benign neoplasm of cecum: Secondary | ICD-10-CM

## 2023-01-26 DIAGNOSIS — D123 Benign neoplasm of transverse colon: Secondary | ICD-10-CM | POA: Diagnosis not present

## 2023-01-26 MED ORDER — SODIUM CHLORIDE 0.9 % IV SOLN: 500.0000 mL | Freq: Once | INTRAVENOUS | Status: AC

## 2023-01-26 NOTE — Progress Notes (Signed)
Gastroenterology History and Physical   Primary Care Physician:  Doreene Nest, NP   Reason for Procedure:   Colon cancer screening  Plan:    colonoscopy     HPI: Kayla Wood is a 67 y.o. female  here for colonoscopy screening - last exam 02/2011 was normal.   Patient denies any bowel symptoms at this time. No family history of colon cancer known. Otherwise feels well without any cardiopulmonary symptoms.   I have discussed risks / benefits of anesthesia and endoscopic procedure with Kayla Wood and they wish to proceed with the exams as outlined today.    Past Medical History:  Diagnosis Date   History of kidney stones    History of UTI     Past Surgical History:  Procedure Laterality Date   BREAST BIOPSY Left 2016   CESAREAN SECTION  1988   FOOT SURGERY Bilateral 2014   HAND SURGERY Right 03/07/2020    Prior to Admission medications   Medication Sig Start Date End Date Taking? Authorizing Provider  Cholecalciferol 25 MCG (1000 UT) capsule Take by mouth. 12/03/19   [provider]  ibuprofen (ADVIL,MOTRIN) 200 MG tablet Take 200 mg by mouth every 8 (eight) hours as needed.    [provider]  Multiple Vitamin (MULTI-VITAMIN) tablet Take by mouth.    [provider]    Current Outpatient Medications  Medication Sig Dispense Refill   Cholecalciferol 25 MCG (1000 UT) capsule Take by mouth.     ibuprofen (ADVIL,MOTRIN) 200 MG tablet Take 200 mg by mouth every 8 (eight) hours as needed.     Multiple Vitamin (MULTI-VITAMIN) tablet Take by mouth.     Current Facility-Administered Medications  Medication Dose Route Frequency Provider Last Rate Last Admin   0.9 %  sodium chloride infusion  500 mL Intravenous Once Avonna Iribe, Willaim Rayas, MD        Allergies as of 01/26/2023   (No Known Allergies)    Family History  Problem Relation Age of Onset   Colon polyps Mother    Heart disease Father    Breast cancer Sister    Irritable bowel  syndrome Sister    Alcohol abuse Maternal Uncle    Alcohol abuse Maternal Uncle    Colon cancer Neg Hx    Esophageal cancer Neg Hx    Rectal cancer Neg Hx    Stomach cancer Neg Hx     Social History   Socioeconomic History   Marital status: Married    Spouse name: Not on file   Number of children: 3   Years of education: Not on file   Highest education level: Not on file  Occupational History   Not on file  Tobacco Use   Smoking status: Former   Smokeless tobacco: Never  Vaping Use   Vaping status: Never Used  Substance and Sexual Activity   Alcohol use: Yes   Drug use: No   Sexual activity: Not on file  Other Topics Concern   Not on file  Social History Narrative   Married.   3 children.   Works for [employer].   Enjoys music, exercising, reading.    Social Determinants of Health   Financial Resource Strain: Low Risk  (12/27/2022)   Overall Financial Resource Strain (CARDIA)    Difficulty of Paying Living Expenses: Not hard at all  Food Insecurity: No Food Insecurity (12/27/2022)   Hunger Vital Sign    Worried About Running Out of Food in the Last  Year: Never true    Ran Out of Food in the Last Year: Never true  Transportation Needs: No Transportation Needs (12/27/2022)   PRAPARE - Administrator, Civil Service (Medical): No    Lack of Transportation (Non-Medical): No  Physical Activity: Sufficiently Active (12/27/2022)   Exercise Vital Sign    Days of Exercise per Week: 5 days    Minutes of Exercise per Session: 60 min  Stress: No Stress Concern Present (12/27/2022)   [employer] of Occupational Health - Occupational Stress Questionnaire    Feeling of Stress : Not at all  Social Connections: Socially Integrated (12/27/2022)   Social Connection and Isolation Panel [NHANES]    Frequency of Communication with Friends and Family: More than three times a week    Frequency of Social Gatherings with Friends and Family: More than three times a week     Attends Religious Services: More than 4 times per year    Active Member of [employer] or Organizations: Yes    Attends Engineer, structural: More than 4 times per year    Marital Status: Married  Catering manager Violence: Not At Risk (12/27/2022)   Humiliation, Afraid, Rape, and Kick questionnaire    Fear of Current or Ex-Partner: No    Emotionally Abused: No    Physically Abused: No    Sexually Abused: No    Review of Systems: All other review of systems negative except as mentioned in the HPI.  Physical Exam: Vital signs BP (!) 157/93   Pulse 72   Temp 98 F (36.7 C)   Ht 5\' 7"  (1.702 m)   Wt 177 lb (80.3 kg)   SpO2 98%   BMI 27.72 kg/m   General:   Alert,  Well-developed, pleasant and cooperative in NAD Lungs:  Clear throughout to auscultation.   Heart:  Regular rate and rhythm Abdomen:  Soft, nontender and nondistended.   Neuro/Psych:  Alert and cooperative. Normal mood and affect. A and O x 3  Harlin Rain, MD [facility]

## 2023-01-26 NOTE — Op Note (Signed)
[facility] Patient Name: Kayla Wood Procedure Date: 01/26/2023 11:15 AM MRN: [id number] Endoscopist: Viviann Spare P. Adela Lank , MD, [id number] Age: 67 Referring MD:  Date of Birth: Mar 04, 1956 Gender: Female Account #: [id number] Procedure:                Colonoscopy Indications:              Screening for colorectal malignant neoplasm - last                            exam 02/2011 was normal Medicines:                Monitored Anesthesia Care Procedure:                Pre-Anesthesia Assessment:                           - Prior to the procedure, a History and Physical                            was performed, and patient medications and                            allergies were reviewed. The patient's tolerance of                            previous anesthesia was also reviewed. The risks                            and benefits of the procedure and the sedation                            options and risks were discussed with the patient.                            All questions were answered, and informed consent                            was obtained. Prior Anticoagulants: The patient has                            taken no anticoagulant or antiplatelet agents. ASA                            Grade Assessment: II - A patient with mild systemic                            disease. After reviewing the risks and benefits,                            the patient was deemed in satisfactory condition to                            undergo the procedure.  After obtaining informed consent, the colonoscope                            was passed under direct vision. Throughout the                            procedure, the patient's blood pressure, pulse, and                            oxygen saturations were monitored continuously. The                            Olympus Scope SN: [id number] was introduced through                            the anus and advanced to the the  cecum, identified                            by appendiceal orifice and ileocecal valve. The                            colonoscopy was performed without difficulty. The                            patient tolerated the procedure well. The quality                            of the bowel preparation was good. The ileocecal                            valve, appendiceal orifice, and rectum were                            photographed. Scope In: 11:27:43 AM Scope Out: 11:52:29 AM Scope Withdrawal Time: 0 hours 19 minutes 9 seconds  Total Procedure Duration: 0 hours 24 minutes 46 seconds  Findings:                 The perianal and digital rectal examinations were                            normal.                           A 10 mm polyp was found in the cecum. The polyp was                            sessile. The polyp was removed with a cold snare.                            Resection and retrieval were complete.                           A 4 mm polyp was found in the transverse colon. The  polyp was sessile. The polyp was removed with a                            cold snare. Resection and retrieval were complete.                           Multiple small-mouthed diverticula were found in                            the transverse colon and left colon.                           Internal hemorrhoids were found during retroflexion.                           The exam was otherwise without abnormality. Complications:            No immediate complications. Estimated blood loss:                            Minimal. Estimated Blood Loss:     Estimated blood loss was minimal. Impression:               - One 10 mm polyp in the cecum, removed with a cold                            snare. Resected and retrieved.                           - One 4 mm polyp in the transverse colon, removed                            with a cold snare. Resected and retrieved.                            - Diverticulosis in the transverse colon and in the                            left colon.                           - Internal hemorrhoids.                           - The examination was otherwise normal. Recommendation:           - Patient has a contact number available for                            emergencies. The signs and symptoms of potential                            delayed complications were discussed with the                            patient. Return to  normal activities tomorrow.                            Written discharge instructions were provided to the                            patient.                           - Resume previous diet.                           - Continue present medications.                           - Await pathology results. Viviann Spare P. Jewel Mcafee, MD 01/26/2023 11:58:29 AM This report has been signed electronically.

## 2023-01-26 NOTE — Progress Notes (Signed)
Pt's states no medical or surgical changes since previsit or office visit. 

## 2023-01-26 NOTE — Progress Notes (Signed)
Called to room to assist during endoscopic procedure.  Patient ID and intended procedure confirmed with present staff. Received instructions for my participation in the procedure from the performing physician.  

## 2023-01-26 NOTE — Progress Notes (Signed)
Report given to PACU, vss 

## 2023-01-26 NOTE — Patient Instructions (Signed)
-  Handout on polyps, diverticulosis and hemorrhoids provided -await pathology results -repeat colonoscopy for surveillance recommended. Date to be determined when pathology result become available   -Continue present medications   YOU HAD AN ENDOSCOPIC PROCEDURE TODAY AT THE [facility]:   Refer to the procedure report that was given to you for any specific questions about what was found during the examination.  If the procedure report does not answer your questions, please call your gastroenterologist to clarify.  If you requested that your care partner not be given the details of your procedure findings, then the procedure report has been included in a sealed envelope for you to review at your convenience later.  YOU SHOULD EXPECT: Some feelings of bloating in the abdomen. Passage of more gas than usual.  Walking can help get rid of the air that was put into your GI tract during the procedure and reduce the bloating. If you had a lower endoscopy (such as a colonoscopy or flexible sigmoidoscopy) you may notice spotting of blood in your stool or on the toilet paper. If you underwent a bowel prep for your procedure, you may not have a normal bowel movement for a few days.  Please Note:  You might notice some irritation and congestion in your nose or some drainage.  This is from the oxygen used during your procedure.  There is no need for concern and it should clear up in a day or so.  SYMPTOMS TO REPORT IMMEDIATELY:  Following lower endoscopy (colonoscopy or flexible sigmoidoscopy):  Excessive amounts of blood in the stool  Significant tenderness or worsening of abdominal pains  Swelling of the abdomen that is new, acute  Fever of 100F or higher  For urgent or emergent issues, a gastroenterologist can be reached at any hour by calling [phone]. Do not use MyChart messaging for urgent concerns.    DIET:  We do recommend a small meal at first, but then you may proceed to your  regular diet.  Drink plenty of fluids but you should avoid alcoholic beverages for 24 hours.  ACTIVITY:  You should plan to take it easy for the rest of today and you should NOT DRIVE or use heavy machinery until tomorrow (because of the sedation medicines used during the test).    FOLLOW UP: Our staff will call the number listed on your records the next business day following your procedure.  We will call around 7:15- 8:00 am to check on you and address any questions or concerns that you may have regarding the information given to you following your procedure. If we do not reach you, we will leave a message.     If any biopsies were taken you will be contacted by phone or by letter within the next 1-3 weeks.  Please call us at [phone] if you have not heard about the biopsies in 3 weeks.    SIGNATURES/CONFIDENTIALITY: You and/or your care partner have signed paperwork which will be entered into your electronic medical record.  These signatures attest to the fact that that the information above on your After Visit Summary has been reviewed and is understood.  Full responsibility of the confidentiality of this discharge information lies with you and/or your care-partner.   

## 2023-01-27 ENCOUNTER — Telehealth: Payer: Self-pay | Admitting: *Deleted

## 2023-01-27 NOTE — Telephone Encounter (Signed)
  Follow up Call-     01/26/2023   10:50 AM  Call back number  Post procedure Call Back phone  # [phone]  Permission to leave phone message Yes     Patient questions:   Message left to call us if necessary.

## 2023-08-11 ENCOUNTER — Encounter: Payer: Self-pay | Admitting: Primary Care

## 2023-08-11 LAB — HM MAMMOGRAPHY

## 2023-09-19 DIAGNOSIS — Z0184 Encounter for antibody response examination: Secondary | ICD-10-CM

## 2023-09-23 ENCOUNTER — Other Ambulatory Visit

## 2023-09-23 DIAGNOSIS — Z0184 Encounter for antibody response examination: Secondary | ICD-10-CM

## 2023-09-24 LAB — MEASLES/MUMPS/RUBELLA IMMUNITY
Mumps IgG: 253 [AU]/ml
Rubella: 20 {index}
Rubeola IgG: 90.9 [AU]/ml

## 2023-09-27 ENCOUNTER — Encounter: Payer: Medicare Other | Admitting: Primary Care

## 2023-11-01 ENCOUNTER — Ambulatory Visit: Payer: PRIVATE HEALTH INSURANCE | Admitting: Primary Care

## 2023-11-02 ENCOUNTER — Encounter: Payer: PRIVATE HEALTH INSURANCE | Admitting: Primary Care

## 2024-01-11 ENCOUNTER — Encounter: Payer: Self-pay | Admitting: Primary Care

## 2024-01-11 ENCOUNTER — Ambulatory Visit: Payer: Self-pay | Admitting: Primary Care

## 2024-01-11 ENCOUNTER — Ambulatory Visit (INDEPENDENT_AMBULATORY_CARE_PROVIDER_SITE_OTHER): Payer: PRIVATE HEALTH INSURANCE | Admitting: Primary Care

## 2024-01-11 VITALS — BP 136/78 | HR 67 | Temp 97.2°F | Ht 67.0 in | Wt 179.0 lb

## 2024-01-11 DIAGNOSIS — E785 Hyperlipidemia, unspecified: Secondary | ICD-10-CM

## 2024-01-11 DIAGNOSIS — Z0000 Encounter for general adult medical examination without abnormal findings: Secondary | ICD-10-CM | POA: Diagnosis not present

## 2024-01-11 DIAGNOSIS — L719 Rosacea, unspecified: Secondary | ICD-10-CM

## 2024-01-11 LAB — COMPREHENSIVE METABOLIC PANEL WITH GFR
ALT: 15 U/L (ref 0–35)
AST: 19 U/L (ref 0–37)
Albumin: 4.8 g/dL (ref 3.5–5.2)
Alkaline Phosphatase: 70 U/L (ref 39–117)
BUN: 15 mg/dL (ref 6–23)
CO2: 29 meq/L (ref 19–32)
Calcium: 9.6 mg/dL (ref 8.4–10.5)
Chloride: 102 meq/L (ref 96–112)
Creatinine, Ser: 0.69 mg/dL (ref 0.40–1.20)
GFR: 89.28 mL/min (ref >60.00)
Glucose, Bld: 99 mg/dL (ref 70–99)
Potassium: 4.5 meq/L (ref 3.5–5.1)
Sodium: 139 meq/L (ref 135–145)
Total Bilirubin: 1.4 mg/dL — ABNORMAL HIGH (ref 0.2–1.2)
Total Protein: 7.4 g/dL (ref 6.0–8.3)

## 2024-01-11 LAB — LIPID PANEL
Cholesterol: 216 mg/dL — ABNORMAL HIGH (ref 0–200)
HDL: 50.1 mg/dL (ref >39.00)
LDL Cholesterol: 139 mg/dL — ABNORMAL HIGH (ref 0–99)
NonHDL: 166.06
Total CHOL/HDL Ratio: 4
Triglycerides: 135 mg/dL (ref 0.0–149.0)
VLDL: 27 mg/dL (ref 0.0–40.0)

## 2024-01-11 NOTE — Assessment & Plan Note (Signed)
 Stable.  Continue compounded cream per dermatology.

## 2024-01-11 NOTE — Patient Instructions (Signed)
 Stop by the lab prior to leaving today. I will notify you of your results once received.   It was a pleasure to see you today!

## 2024-01-11 NOTE — Assessment & Plan Note (Signed)
 Repeat lipid panel pending.

## 2024-01-11 NOTE — Assessment & Plan Note (Signed)
 Immunizations UTD.  Mammogram and bone density scan UTD. Colonoscopy UTD, due 2027  Discussed the importance of a healthy diet and regular exercise in order for weight loss, and to reduce the risk of further co-morbidity.  Exam stable. Labs pending.  Follow up in 1 year for repeat physical.

## 2024-01-11 NOTE — Progress Notes (Signed)
 Subjective:    Patient ID: Kayla Wood, female    DOB: 1956/02/24, 68 y.o.   MRN: [id number]  HPI  Kayla Wood is a very pleasant 68 y.o. female who presents today for complete physical and follow up of chronic conditions.    Wt Readings from Last 3 Encounters:  01/11/24 179 lb (81.2 kg)  01/26/23 177 lb (80.3 kg)  12/27/22 177 lb (80.3 kg)     Immunizations: -Tetanus: Completed in 2020  -Shingles: Completed Shingrix series -Pneumonia: Completed in 2024  Diet: Fair diet.  Exercise: No regular exercise.  Eye exam: Completes annually  Dental exam: Completes semi-annually     Mammogram: Completed in 2025 Bone Density Scan: Completed in 2024  Colonoscopy: Completed in 2024, due 2027  BP Readings from Last 3 Encounters:  01/11/24 136/78  01/26/23 (!) 141/82  09/24/22 (!) 142/78        Review of Systems  Constitutional:  Negative for unexpected weight change.  HENT:  Negative for rhinorrhea.   Respiratory:  Negative for cough and shortness of breath.   Cardiovascular:  Negative for chest pain.  Gastrointestinal:  Negative for constipation and diarrhea.  Genitourinary:  Negative for difficulty urinating.  Musculoskeletal:  Negative for arthralgias and myalgias.  Skin:  Negative for rash.  Allergic/Immunologic: Negative for environmental allergies.  Neurological:  Negative for dizziness and headaches.  Psychiatric/Behavioral:  The patient is not nervous/anxious.          Past Medical History:  Diagnosis Date   History of kidney stones    History of UTI     Social History   Socioeconomic History   Marital status: Married    Spouse name: Not on file   Number of children: 3   Years of education: Not on file   Highest education level: Not on file  Occupational History   Not on file  Tobacco Use   Smoking status: Former   Smokeless tobacco: Never  Vaping Use   Vaping status: Never Used  Substance and Sexual Activity   Alcohol use: Yes   Drug use: No    Sexual activity: Not on file  Other Topics Concern   Not on file  Social History Narrative   Married.   3 children.   Works for [employer].   Enjoys music, exercising, reading.    Social Drivers of Corporate investment banker Strain: Low Risk  (12/27/2022)   Overall Financial Resource Strain (CARDIA)    Difficulty of Paying Living Expenses: Not hard at all  Food Insecurity: No Food Insecurity (12/27/2022)   Hunger Vital Sign    Worried About Running Out of Food in the Last Year: Never true    Ran Out of Food in the Last Year: Never true  Transportation Needs: No Transportation Needs (12/27/2022)   PRAPARE - Administrator, Civil Service (Medical): No    Lack of Transportation (Non-Medical): No  Physical Activity: Sufficiently Active (12/27/2022)   Exercise Vital Sign    Days of Exercise per Week: 5 days    Minutes of Exercise per Session: 60 min  Stress: No Stress Concern Present (12/27/2022)   [employer] of Occupational Health - Occupational Stress Questionnaire    Feeling of Stress : Not at all  Social Connections: Socially Integrated (12/27/2022)   Social Connection and Isolation Panel    Frequency of Communication with Friends and Family: More than three times a week    Frequency of Social Gatherings with Friends and Family:  More than three times a week    Attends Religious Services: More than 4 times per year    Active Member of Clubs or Organizations: Yes    Attends Banker Meetings: More than 4 times per year    Marital Status: Married  Catering manager Violence: Not At Risk (12/27/2022)   Humiliation, Afraid, Rape, and Kick questionnaire    Fear of Current or Ex-Partner: No    Emotionally Abused: No    Physically Abused: No    Sexually Abused: No    Past Surgical History:  Procedure Laterality Date   BREAST BIOPSY Left 2016   CESAREAN SECTION  1988   FOOT SURGERY Bilateral 2014   HAND SURGERY Right 03/07/2020    Family History   Problem Relation Age of Onset   Colon polyps Mother    Heart disease Father    Breast cancer Sister    Irritable bowel syndrome Sister    Alcohol abuse Maternal Uncle    Alcohol abuse Maternal Uncle    Colon cancer Neg Hx    Esophageal cancer Neg Hx    Rectal cancer Neg Hx    Stomach cancer Neg Hx     No Known Allergies  Current Outpatient Medications on File Prior to Visit  Medication Sig Dispense Refill   Cholecalciferol 25 MCG (1000 UT) capsule Take by mouth.     ibuprofen (ADVIL,MOTRIN) 200 MG tablet Take 200 mg by mouth every 8 (eight) hours as needed.     Multiple Vitamin (MULTI-VITAMIN) tablet Take by mouth.     Current Facility-Administered Medications on File Prior to Visit  Medication Dose Route Frequency Provider Last Rate Last Admin   0.9 %  sodium chloride  infusion  500 mL Intravenous Once Armbruster, Elspeth SQUIBB, MD        BP 136/78   Pulse 67   Temp (!) 97.2 F (36.2 C) (Temporal)   Ht 5' 7 (1.702 m)   Wt 179 lb (81.2 kg)   SpO2 96%   BMI 28.04 kg/m  Objective:   Physical Exam HENT:     Right Ear: Tympanic membrane and ear canal normal.     Left Ear: Tympanic membrane and ear canal normal.  Eyes:     Pupils: Pupils are equal, round, and reactive to light.  Cardiovascular:     Rate and Rhythm: Normal rate and regular rhythm.  Pulmonary:     Effort: Pulmonary effort is normal.     Breath sounds: Normal breath sounds.  Abdominal:     General: Bowel sounds are normal.     Palpations: Abdomen is soft.     Tenderness: There is no abdominal tenderness.  Musculoskeletal:        General: Normal range of motion.     Cervical back: Neck supple.  Skin:    General: Skin is warm and dry.  Neurological:     Mental Status: She is alert and oriented to person, place, and time.     Cranial Nerves: No cranial nerve deficit.     Deep Tendon Reflexes:     Reflex Scores:      Patellar reflexes are 2+ on the right side and 2+ on the left side. Psychiatric:         Mood and Affect: Mood normal.           Assessment & Plan:  Preventative health care Assessment & Plan: Immunizations UTD.  Mammogram and bone density scan UTD Colonoscopy UTD, due 2027  Discussed the importance of a healthy diet and regular exercise in order for weight loss, and to reduce the risk of further co-morbidity.  Exam stable. Labs pending.  Follow up in 1 year for repeat physical.    Rosacea Assessment & Plan: Stable.  Continue compounded cream per dermatology.    Hyperlipidemia, unspecified hyperlipidemia type Assessment & Plan: Repeat lipid panel pending.  Orders: -     Lipid panel -     Comprehensive metabolic panel with GFR        Comer MARLA Gaskins, NP

## 2024-01-24 ENCOUNTER — Encounter: Payer: Self-pay | Admitting: *Deleted

## 2024-02-10 ENCOUNTER — Ambulatory Visit: Payer: PRIVATE HEALTH INSURANCE

## 2024-02-10 VITALS — Ht 67.0 in | Wt 179.0 lb

## 2024-02-10 DIAGNOSIS — Z0000 Encounter for general adult medical examination without abnormal findings: Secondary | ICD-10-CM

## 2024-02-10 NOTE — Progress Notes (Signed)
 Please attest and cosign this visit due to patients primary care provider not being in the office at the time the visit was completed.     Subjective:   Kayla Wood is a 68 y.o. who presents for a Medicare Wellness preventive visit.  As a reminder, Annual Wellness Visits don't include a physical exam, and some assessments may be limited, especially if this visit is performed virtually. We may recommend an in-person follow-up visit with your provider if needed.  Visit Complete: Virtual I connected with  Kayla Wood on 02/10/24 by a audio enabled telemedicine application and verified that I am speaking with the correct person using two identifiers.  Patient Location: Home  Provider Location: Office/Clinic  I discussed the limitations of evaluation and management by telemedicine. The patient expressed understanding and agreed to proceed.  Vital Signs: Because this visit was a virtual/telehealth visit, some criteria may be missing or patient reported. Any vitals not documented were not able to be obtained and vitals that have been documented are patient reported.  VideoDeclined- This patient declined Librarian, academic. Therefore the visit was completed with audio only.  Persons Participating in Visit: Patient.  AWV Questionnaire: Yes: Patient Medicare AWV questionnaire was completed by the patient on 02/03/24; I have confirmed that all information answered by patient is correct and no changes since this date.  Cardiac Risk Factors include: advanced age (>62men, >74 women);dyslipidemia     Objective:    Today's Vitals   02/10/24 0851  Weight: 179 lb (81.2 kg)  Height: 5' 7 (1.702 m)   Body mass index is 28.04 kg/m.     02/10/2024    8:57 AM 12/27/2022    8:14 AM  Advanced Directives  Does Patient Have a Medical Advance Directive? Yes Yes  Type of Estate agent of [place];Living will Healthcare Power of [place];Living will  Copy of  Healthcare Power of Attorney in Chart? No - copy requested No - copy requested    Current Medications (verified) Outpatient Encounter Medications as of 02/10/2024  Medication Sig   Cholecalciferol 25 MCG (1000 UT) capsule Take by mouth.   ibuprofen (ADVIL,MOTRIN) 200 MG tablet Take 200 mg by mouth every 8 (eight) hours as needed.   Multiple Vitamin (MULTI-VITAMIN) tablet Take by mouth.   Facility-Administered Encounter Medications as of 02/10/2024  Medication   0.9 %  sodium chloride  infusion    Allergies (verified) Patient has no known allergies.   History: Past Medical History:  Diagnosis Date   History of kidney stones    History of UTI    Past Surgical History:  Procedure Laterality Date   BREAST BIOPSY Left 2016   CESAREAN SECTION  1988   FOOT SURGERY Bilateral 2014   HAND SURGERY Right 03/07/2020   Family History  Problem Relation Age of Onset   Colon polyps Mother    Heart disease Father    Breast cancer Sister    Irritable bowel syndrome Sister    Alcohol abuse Maternal Uncle    Alcohol abuse Maternal Uncle    Colon cancer Neg Hx    Esophageal cancer Neg Hx    Rectal cancer Neg Hx    Stomach cancer Neg Hx    Social History   Socioeconomic History   Marital status: Married    Spouse name: Not on file   Number of children: 3   Years of education: Not on file   Highest education level: Not on file  Occupational History  Not on file  Tobacco Use   Smoking status: Former   Smokeless tobacco: Never  Vaping Use   Vaping status: Never Used  Substance and Sexual Activity   Alcohol use: Yes   Drug use: No   Sexual activity: Not on file  Other Topics Concern   Not on file  Social History Narrative   Married.   3 children.   Works for [employer].   Enjoys music, exercising, reading.    Social Drivers of Corporate investment banker Strain: Low Risk  (02/10/2024)   Overall Financial Resource Strain (CARDIA)    Difficulty of Paying Living Expenses: Not hard  at all  Food Insecurity: No Food Insecurity (02/10/2024)   Hunger Vital Sign    Worried About Running Out of Food in the Last Year: Never true    Ran Out of Food in the Last Year: Never true  Transportation Needs: No Transportation Needs (02/10/2024)   PRAPARE - Administrator, Civil Service (Medical): No    Lack of Transportation (Non-Medical): No  Physical Activity: Sufficiently Active (02/10/2024)   Exercise Vital Sign    Days of Exercise per Week: 5 days    Minutes of Exercise per Session: 60 min  Stress: No Stress Concern Present (02/10/2024)   Kayla Wood of Occupational Health - Occupational Stress Questionnaire    Feeling of Stress: Not at all  Social Connections: Socially Integrated (02/10/2024)   Social Connection and Isolation Panel    Frequency of Communication with Friends and Family: More than three times a week    Frequency of Social Gatherings with Friends and Family: More than three times a week    Attends Religious Services: More than 4 times per year    Active Member of [employer] or Organizations: Yes    Attends Engineer, structural: More than 4 times per year    Marital Status: Married    Tobacco Counseling Counseling given: Not Answered   Clinical Intake:  Pre-visit preparation completed: Yes  Pain : No/denies pain    BMI - recorded: 28.04 Nutritional Status: BMI 25 -29 Overweight Nutritional Risks: None Diabetes: No  No results found for: HGBA1C   How often do you need to have someone help you when you read instructions, pamphlets, or other written materials from your doctor or pharmacy?: 1 - Never  Interpreter Needed?: No  Comments: lives with husband Information entered by :: B.Emmalea Treanor,LPN   Activities of Daily Living     02/03/2024    9:31 AM  In your present state of health, do you have any difficulty performing the following activities:  Hearing? 0  Vision? 0  Difficulty concentrating or making decisions? 0   Walking or climbing stairs? 0  Dressing or bathing? 0  Doing errands, shopping? 0  Preparing Food and eating ? N  Using the Toilet? N  In the past six months, have you accidently leaked urine? N  Do you have problems with loss of bowel control? N  Managing your Medications? N  Managing your Finances? N  Housekeeping or managing your Housekeeping? N    Patient Care Team: Gretta Comer POUR, NP as PCP - General (Internal Medicine) Ishmael Slough, MD as Consulting Physician (Rheumatology) [place], [facility]  I have updated your Care Teams any recent Medical Services you may have received from other providers in the past year.     Assessment:   This is a routine wellness examination for Kayla Wood.  Hearing/Vision screen  Hearing Screening - Comments:: Patient denies any hearing difficulties.   Vision Screening - Comments:: Pt says their vision is good with glasses Dr  Loni for appt-will call   Goals Addressed             This Visit's Progress    Patient Stated       02/10/24-Lose 20 pounds.       Depression Screen     02/10/2024    8:55 AM 01/11/2024    8:18 AM 12/27/2022    8:13 AM 09/24/2022   11:45 AM 05/20/2020    2:08 PM  PHQ 2/9 Scores  PHQ - 2 Score 0 0 0 0 0  PHQ- 9 Score     0    Fall Risk     02/03/2024    9:31 AM 01/11/2024    8:17 AM 12/27/2022    8:15 AM 12/24/2022    7:48 AM 09/24/2022   11:45 AM  Fall Risk   Falls in the past year? 0 0 0 0 0  Number falls in past yr: 0 0 0 0 0  Injury with Fall? 0 0 0  0  Risk for fall due to : No Fall Risks No Fall Risks No Fall Risks  No Fall Risks  Follow up Falls prevention discussed;Education provided Falls evaluation completed Falls prevention discussed;Falls evaluation completed  Falls evaluation completed    MEDICARE RISK AT HOME:  Medicare Risk at Home Any stairs in or around the home?: (Patient-Rptd) Yes If so, are there any without handrails?: (Patient-Rptd) No Home free of loose throw rugs  in walkways, pet beds, electrical cords, etc?: (Patient-Rptd) Yes Adequate lighting in your home to reduce risk of falls?: (Patient-Rptd) Yes Life alert?: (Patient-Rptd) No Use of a cane, walker or w/c?: (Patient-Rptd) No Grab bars in the bathroom?: (Patient-Rptd) No Shower chair or bench in shower?: (Patient-Rptd) No Elevated toilet seat or a handicapped toilet?: (Patient-Rptd) Yes  TIMED UP AND GO:  Was the test performed?  No  Cognitive Function: 6CIT completed        02/10/2024    9:01 AM 12/27/2022    8:17 AM  6CIT Screen  What Year? 0 points 0 points  What month? 0 points 0 points  What time? 0 points 0 points  Count back from 20 0 points 0 points  Months in reverse 0 points 0 points  Repeat phrase 0 points 0 points  Total Score 0 points 0 points    Immunizations Immunization History  Administered Date(s) Administered   Influenza-Unspecified 03/03/2020   PFIZER(Purple Top)SARS-COV-2 Vaccination 08/31/2019, 09/25/2019, 04/26/2020   PNEUMOCOCCAL CONJUGATE-20 09/24/2022   Tdap 08/30/2018   Zoster Recombinant(Shingrix) 05/10/2017, 09/02/2017    Screening Tests Health Maintenance  Topic Date Due   COVID-19 Vaccine (4 - 2024-25 season) 02/13/2023   INFLUENZA VACCINE  01/13/2024   Medicare Annual Wellness (AWV)  02/09/2025   MAMMOGRAM  08/10/2025   Colonoscopy  01/25/2026   DTaP/Tdap/Td (2 - Td or Tdap) 08/29/2028   Pneumococcal Vaccine: 50+ Years  Completed   DEXA SCAN  Completed   Hepatitis C Screening  Completed   Zoster Vaccines- Shingrix  Completed   HPV VACCINES  Aged Out   Meningococcal B Vaccine  Aged Out    Health Maintenance  Health Maintenance Due  Topic Date Due   COVID-19 Vaccine (4 - 2024-25 season) 02/13/2023   INFLUENZA VACCINE  01/13/2024   Health Maintenance Items Addressed: None due at this time. Pt will receive vaccines  at their pharmcy when decided to obtain   Additional Screening:  Vision Screening: Recommended annual  ophthalmology exams for early detection of glaucoma and other disorders of the eye. Would you like a referral to an eye doctor? No    Dental Screening: Recommended annual dental exams for proper oral hygiene  Community Resource Referral / Chronic Care Management: CRR required this visit?  No   CCM required this visit?  Appt scheduled with PCP   Plan:    I have personally reviewed and noted the following in the patient's chart:   Medical and social history Use of alcohol, tobacco or illicit drugs  Current medications and supplements including opioid prescriptions. Patient is not currently taking opioid prescriptions. Functional ability and status Nutritional status Physical activity Advanced directives List of other physicians Hospitalizations, surgeries, and ER visits in previous 12 months Vitals Screenings to include cognitive, depression, and falls Referrals and appointments  In addition, I have reviewed and discussed with patient certain preventive protocols, quality metrics, and best practice recommendations. A written personalized care plan for preventive services as well as general preventive health recommendations were provided to patient.   Erminio LITTIE Saris, LPN   [id number]/[id number]   After Visit Summary: (MyChart) Due to this being a telephonic visit, the after visit summary with patients personalized plan was offered to patient via MyChart   Notes: Nothing significant to report at this time.

## 2024-02-10 NOTE — Patient Instructions (Signed)
 Ms. Cafaro , Thank you for taking time out of your busy schedule to complete your Annual Wellness Visit with me. I enjoyed our conversation and look forward to speaking with you again next year. I, as well as your care team,  appreciate your ongoing commitment to your health goals. Please review the following plan we discussed and let me know if I can assist you in the future. Your Game plan/ To Do List    Referrals: If you haven't heard from the office you've been referred to, please reach out to them at the phone provided.   Follow up Visits: We will see or speak with you next year for your Next Medicare AWV with our clinical staff Have you seen your provider in the last 6 months (3 months if uncontrolled diabetes)?   Clinician Recommendations:  Aim for 30 minutes of exercise or brisk walking, 6-8 glasses of water, and 5 servings of fruits and vegetables each day.       This is a list of the screenings recommended for you:  Health Maintenance  Topic Date Due   COVID-19 Vaccine (4 - 2024-25 season) 02/13/2023   Flu Shot  01/13/2024   Medicare Annual Wellness Visit  02/09/2025   Mammogram  08/10/2025   Colon Cancer Screening  01/25/2026   DTaP/Tdap/Td vaccine (2 - Td or Tdap) 08/29/2028   Pneumococcal Vaccine for age over 48  Completed   DEXA scan (bone density measurement)  Completed   Hepatitis C Screening  Completed   Zoster (Shingles) Vaccine  Completed   HPV Vaccine  Aged Out   Meningitis B Vaccine  Aged Out    Advanced directives:  Advance Care Planning is important because it:  [x]  Makes sure you receive the medical care that is consistent with your values, goals, and preferences  [x]  It provides guidance to your family and loved ones and reduces their decisional burden about whether or not they are making the right decisions based on your wishes.  Follow the link provided in your after visit summary or read over the paperwork we have mailed to you to help you started  getting your Advance Directives in place. If you need assistance in completing these, please reach out to us  so that we can help you!

## 2024-02-14 ENCOUNTER — Ambulatory Visit: Payer: Self-pay | Admitting: Primary Care

## 2024-02-14 ENCOUNTER — Ambulatory Visit
Admission: RE | Admit: 2024-02-14 | Discharge: 2024-02-14 | Disposition: A | Discharge status: Home / Self Care | Payer: Self-pay | Source: Ambulatory Visit | Attending: Primary Care | Admitting: Primary Care

## 2024-02-14 DIAGNOSIS — E785 Hyperlipidemia, unspecified: Secondary | ICD-10-CM | POA: Insufficient documentation

## 2024-02-15 MED ORDER — ATORVASTATIN CALCIUM 20 MG PO TABS
20.0000 mg | ORAL_TABLET | Freq: Every day | ORAL | 0 refills | Status: DC
Start: 2024-02-15 — End: 2024-04-27

## 2024-02-22 DIAGNOSIS — Z23 Encounter for immunization: Secondary | ICD-10-CM

## 2024-02-24 MED ORDER — COVID-19 MRNA VACC (MODERNA) 50 MCG/0.5ML IM SUSY: 0.5000 mL | PREFILLED_SYRINGE | Freq: Once | INTRAMUSCULAR | 0 refills | Status: AC

## 2024-04-26 ENCOUNTER — Ambulatory Visit: Payer: Self-pay | Admitting: Primary Care

## 2024-04-26 ENCOUNTER — Other Ambulatory Visit

## 2024-04-26 DIAGNOSIS — E785 Hyperlipidemia, unspecified: Secondary | ICD-10-CM

## 2024-04-26 DIAGNOSIS — R17 Unspecified jaundice: Secondary | ICD-10-CM

## 2024-04-26 LAB — HEPATIC FUNCTION PANEL
ALT: 32 U/L (ref 0–35)
AST: 25 U/L (ref 0–37)
Albumin: 4.6 g/dL (ref 3.5–5.2)
Alkaline Phosphatase: 84 U/L (ref 39–117)
Bilirubin, Direct: 0.3 mg/dL (ref 0.0–0.3)
Total Bilirubin: 1.5 mg/dL — ABNORMAL HIGH (ref 0.2–1.2)
Total Protein: 6.9 g/dL (ref 6.0–8.3)

## 2024-04-26 LAB — LIPID PANEL
Cholesterol: 111 mg/dL (ref 0–200)
HDL: 43.9 mg/dL (ref >39.00)
LDL Cholesterol: 56 mg/dL (ref 0–99)
NonHDL: 67.03
Total CHOL/HDL Ratio: 3
Triglycerides: 56 mg/dL (ref 0.0–149.0)
VLDL: 11.2 mg/dL (ref 0.0–40.0)

## 2024-04-27 MED ORDER — ATORVASTATIN CALCIUM 10 MG PO TABS: 10.0000 mg | ORAL_TABLET | Freq: Every day | ORAL | 1 refills | Status: AC

## 2025-01-15 ENCOUNTER — Encounter: Admitting: Primary Care

## 2025-02-13 ENCOUNTER — Ambulatory Visit
